# Patient Record
Sex: Female | Born: 1955
Health system: Southern US, Community
[De-identification: ages and names within clinical notes are randomized; demographics above are authoritative.]

## PROBLEM LIST (undated history)

## (undated) DIAGNOSIS — C801 Malignant (primary) neoplasm, unspecified: Secondary | ICD-10-CM

## (undated) DIAGNOSIS — G4730 Sleep apnea, unspecified: Secondary | ICD-10-CM

## (undated) DIAGNOSIS — D649 Anemia, unspecified: Secondary | ICD-10-CM

## (undated) DIAGNOSIS — R51 Headache: Secondary | ICD-10-CM

## (undated) DIAGNOSIS — M1990 Unspecified osteoarthritis, unspecified site: Secondary | ICD-10-CM

## (undated) DIAGNOSIS — D573 Sickle-cell trait: Secondary | ICD-10-CM

## (undated) DIAGNOSIS — R519 Headache, unspecified: Secondary | ICD-10-CM

## (undated) DIAGNOSIS — E785 Hyperlipidemia, unspecified: Secondary | ICD-10-CM

## (undated) DIAGNOSIS — F419 Anxiety disorder, unspecified: Secondary | ICD-10-CM

## (undated) HISTORY — PX: OTHER SURGICAL HISTORY: SHX169

## (undated) HISTORY — PX: TUBAL LIGATION: SHX77

## (undated) HISTORY — PX: EYE SURGERY: SHX253

## (undated) HISTORY — PX: FOOT SURGERY: SHX648

## (undated) HISTORY — PX: HERNIA REPAIR: SHX51

## (undated) HISTORY — PX: ABDOMINAL HYSTERECTOMY: SHX81

---

## 1998-05-21 ENCOUNTER — Inpatient Hospital Stay (HOSPITAL_COMMUNITY): Admission: AD | Admit: 1998-05-21 | Discharge: 1998-05-21 | Payer: Self-pay | Admitting: Obstetrics and Gynecology

## 1999-09-12 ENCOUNTER — Other Ambulatory Visit: Admission: RE | Admit: 1999-09-12 | Discharge: 1999-09-12 | Payer: Self-pay | Admitting: Obstetrics and Gynecology

## 1999-10-06 ENCOUNTER — Ambulatory Visit (HOSPITAL_COMMUNITY): Admission: RE | Admit: 1999-10-06 | Discharge: 1999-10-06 | Payer: Self-pay | Admitting: Obstetrics and Gynecology

## 1999-10-06 ENCOUNTER — Encounter: Payer: Self-pay | Admitting: Obstetrics and Gynecology

## 2000-03-10 ENCOUNTER — Ambulatory Visit (HOSPITAL_COMMUNITY): Admission: RE | Admit: 2000-03-10 | Discharge: 2000-03-10 | Payer: Self-pay | Admitting: Family Medicine

## 2000-03-10 ENCOUNTER — Encounter: Payer: Self-pay | Admitting: Family Medicine

## 2000-11-18 ENCOUNTER — Other Ambulatory Visit: Admission: RE | Admit: 2000-11-18 | Discharge: 2000-11-18 | Payer: Self-pay | Admitting: Obstetrics and Gynecology

## 2000-11-25 ENCOUNTER — Inpatient Hospital Stay (HOSPITAL_COMMUNITY): Admission: RE | Admit: 2000-11-25 | Discharge: 2000-11-28 | Payer: Self-pay | Admitting: Obstetrics and Gynecology

## 2001-07-08 ENCOUNTER — Ambulatory Visit (HOSPITAL_COMMUNITY): Admission: RE | Admit: 2001-07-08 | Discharge: 2001-07-08 | Payer: Self-pay | Admitting: Obstetrics and Gynecology

## 2001-07-08 ENCOUNTER — Encounter: Payer: Self-pay | Admitting: Obstetrics and Gynecology

## 2002-03-24 ENCOUNTER — Encounter: Payer: Self-pay | Admitting: Emergency Medicine

## 2002-03-24 ENCOUNTER — Emergency Department (HOSPITAL_COMMUNITY): Admission: EM | Admit: 2002-03-24 | Discharge: 2002-03-24 | Payer: Self-pay | Admitting: Emergency Medicine

## 2002-04-25 ENCOUNTER — Other Ambulatory Visit: Admission: RE | Admit: 2002-04-25 | Discharge: 2002-04-25 | Payer: Self-pay | Admitting: Family Medicine

## 2002-08-18 ENCOUNTER — Emergency Department (HOSPITAL_COMMUNITY): Admission: EM | Admit: 2002-08-18 | Discharge: 2002-08-18 | Payer: Self-pay | Admitting: Emergency Medicine

## 2002-09-25 ENCOUNTER — Encounter: Admission: RE | Admit: 2002-09-25 | Discharge: 2002-11-20 | Payer: Self-pay | Admitting: Occupational Medicine

## 2002-11-29 ENCOUNTER — Encounter: Admission: RE | Admit: 2002-11-29 | Discharge: 2002-11-29 | Payer: Self-pay | Admitting: Family Medicine

## 2002-11-29 ENCOUNTER — Encounter: Payer: Self-pay | Admitting: Family Medicine

## 2003-01-25 ENCOUNTER — Ambulatory Visit (HOSPITAL_COMMUNITY): Admission: RE | Admit: 2003-01-25 | Discharge: 2003-01-25 | Payer: Self-pay | Admitting: Family Medicine

## 2003-01-25 ENCOUNTER — Encounter: Payer: Self-pay | Admitting: Family Medicine

## 2003-05-04 ENCOUNTER — Other Ambulatory Visit: Admission: RE | Admit: 2003-05-04 | Discharge: 2003-05-04 | Payer: Self-pay | Admitting: Family Medicine

## 2003-11-10 HISTORY — PX: PORTACATH PLACEMENT: SHX2246

## 2003-11-10 HISTORY — PX: COLON SURGERY: SHX602

## 2004-01-03 ENCOUNTER — Other Ambulatory Visit: Admission: RE | Admit: 2004-01-03 | Discharge: 2004-01-03 | Payer: Self-pay | Admitting: Obstetrics and Gynecology

## 2004-01-17 ENCOUNTER — Ambulatory Visit (HOSPITAL_COMMUNITY): Admission: RE | Admit: 2004-01-17 | Discharge: 2004-01-17 | Payer: Self-pay | Admitting: Gastroenterology

## 2004-01-17 ENCOUNTER — Encounter (INDEPENDENT_AMBULATORY_CARE_PROVIDER_SITE_OTHER): Payer: Self-pay | Admitting: *Deleted

## 2004-01-23 ENCOUNTER — Ambulatory Visit (HOSPITAL_COMMUNITY): Admission: RE | Admit: 2004-01-23 | Discharge: 2004-01-23 | Payer: Self-pay | Admitting: Gastroenterology

## 2004-02-08 ENCOUNTER — Inpatient Hospital Stay (HOSPITAL_COMMUNITY): Admission: RE | Admit: 2004-02-08 | Discharge: 2004-02-16 | Payer: Self-pay | Admitting: General Surgery

## 2004-02-08 ENCOUNTER — Encounter (INDEPENDENT_AMBULATORY_CARE_PROVIDER_SITE_OTHER): Payer: Self-pay | Admitting: Specialist

## 2004-02-17 ENCOUNTER — Inpatient Hospital Stay (HOSPITAL_COMMUNITY): Admission: AD | Admit: 2004-02-17 | Discharge: 2004-02-19 | Payer: Self-pay | Admitting: General Surgery

## 2004-04-03 ENCOUNTER — Ambulatory Visit (HOSPITAL_COMMUNITY): Admission: RE | Admit: 2004-04-03 | Discharge: 2004-04-03 | Payer: Self-pay | Admitting: Obstetrics and Gynecology

## 2004-04-17 ENCOUNTER — Ambulatory Visit (HOSPITAL_COMMUNITY): Admission: RE | Admit: 2004-04-17 | Discharge: 2004-04-17 | Payer: Self-pay | Admitting: Oncology

## 2004-07-18 ENCOUNTER — Ambulatory Visit (HOSPITAL_COMMUNITY): Admission: RE | Admit: 2004-07-18 | Discharge: 2004-07-18 | Payer: Self-pay | Admitting: Oncology

## 2004-08-30 ENCOUNTER — Ambulatory Visit: Payer: Self-pay | Admitting: Oncology

## 2004-09-10 ENCOUNTER — Ambulatory Visit (HOSPITAL_COMMUNITY): Admission: RE | Admit: 2004-09-10 | Discharge: 2004-09-10 | Payer: Self-pay | Admitting: Oncology

## 2004-10-15 ENCOUNTER — Ambulatory Visit (HOSPITAL_COMMUNITY): Admission: RE | Admit: 2004-10-15 | Discharge: 2004-10-16 | Payer: Self-pay | Admitting: General Surgery

## 2004-11-07 ENCOUNTER — Ambulatory Visit (HOSPITAL_BASED_OUTPATIENT_CLINIC_OR_DEPARTMENT_OTHER): Admission: RE | Admit: 2004-11-07 | Discharge: 2004-11-07 | Payer: Self-pay | Admitting: *Deleted

## 2004-11-18 ENCOUNTER — Ambulatory Visit: Payer: Self-pay | Admitting: Oncology

## 2004-12-11 ENCOUNTER — Ambulatory Visit (HOSPITAL_COMMUNITY): Admission: RE | Admit: 2004-12-11 | Discharge: 2004-12-11 | Payer: Self-pay | Admitting: Oncology

## 2005-01-09 ENCOUNTER — Ambulatory Visit (HOSPITAL_COMMUNITY): Admission: RE | Admit: 2005-01-09 | Discharge: 2005-01-09 | Payer: Self-pay | Admitting: Urology

## 2005-01-20 ENCOUNTER — Ambulatory Visit (HOSPITAL_COMMUNITY): Admission: RE | Admit: 2005-01-20 | Discharge: 2005-01-20 | Payer: Self-pay | Admitting: Oncology

## 2005-01-23 ENCOUNTER — Ambulatory Visit: Payer: Self-pay | Admitting: Oncology

## 2005-01-27 ENCOUNTER — Ambulatory Visit (HOSPITAL_COMMUNITY): Admission: RE | Admit: 2005-01-27 | Discharge: 2005-01-27 | Payer: Self-pay | Admitting: Oncology

## 2005-04-01 ENCOUNTER — Ambulatory Visit (HOSPITAL_COMMUNITY): Admission: RE | Admit: 2005-04-01 | Discharge: 2005-04-01 | Payer: Self-pay | Admitting: Obstetrics and Gynecology

## 2005-05-20 ENCOUNTER — Other Ambulatory Visit: Admission: RE | Admit: 2005-05-20 | Discharge: 2005-05-20 | Payer: Self-pay | Admitting: Obstetrics and Gynecology

## 2005-06-22 ENCOUNTER — Ambulatory Visit: Payer: Self-pay | Admitting: Oncology

## 2005-08-05 ENCOUNTER — Ambulatory Visit (HOSPITAL_COMMUNITY): Admission: RE | Admit: 2005-08-05 | Discharge: 2005-08-05 | Payer: Self-pay | Admitting: Gastroenterology

## 2006-04-06 ENCOUNTER — Ambulatory Visit (HOSPITAL_COMMUNITY): Admission: RE | Admit: 2006-04-06 | Discharge: 2006-04-06 | Payer: Self-pay | Admitting: Obstetrics and Gynecology

## 2006-06-02 ENCOUNTER — Ambulatory Visit (HOSPITAL_COMMUNITY): Admission: RE | Admit: 2006-06-02 | Discharge: 2006-06-02 | Payer: Self-pay | Admitting: Oncology

## 2006-06-18 ENCOUNTER — Other Ambulatory Visit: Admission: RE | Admit: 2006-06-18 | Discharge: 2006-06-18 | Payer: Self-pay | Admitting: Obstetrics and Gynecology

## 2006-06-21 ENCOUNTER — Ambulatory Visit: Payer: Self-pay | Admitting: Oncology

## 2006-06-23 LAB — CBC WITH DIFFERENTIAL/PLATELET
Basophils Absolute: 0 10*3/uL (ref 0.0–0.1)
Eosinophils Absolute: 0 10*3/uL (ref 0.0–0.5)
HCT: 37.2 % (ref 34.8–46.6)
HGB: 12.8 g/dL (ref 11.6–15.9)
LYMPH%: 49.3 % — ABNORMAL HIGH (ref 14.0–48.0)
MCV: 86.7 fL (ref 81.0–101.0)
MONO%: 9.7 % (ref 0.0–13.0)
NEUT#: 2.4 10*3/uL (ref 1.5–6.5)
NEUT%: 39.8 % (ref 39.6–76.8)
Platelets: 376 10*3/uL (ref 145–400)
RBC: 4.29 10*6/uL (ref 3.70–5.32)

## 2006-06-23 LAB — COMPREHENSIVE METABOLIC PANEL
BUN: 12 mg/dL (ref 6–23)
CO2: 29 mEq/L (ref 19–32)
Calcium: 9.9 mg/dL (ref 8.4–10.5)
Creatinine, Ser: 0.84 mg/dL (ref 0.40–1.20)
Glucose, Bld: 81 mg/dL (ref 70–99)
Total Bilirubin: 0.6 mg/dL (ref 0.3–1.2)

## 2006-11-09 DIAGNOSIS — G4730 Sleep apnea, unspecified: Secondary | ICD-10-CM

## 2006-11-09 HISTORY — DX: Sleep apnea, unspecified: G47.30

## 2006-12-10 ENCOUNTER — Ambulatory Visit: Payer: Self-pay | Admitting: Oncology

## 2007-04-08 ENCOUNTER — Ambulatory Visit (HOSPITAL_COMMUNITY): Admission: RE | Admit: 2007-04-08 | Discharge: 2007-04-08 | Payer: Self-pay | Admitting: Oncology

## 2007-06-07 ENCOUNTER — Ambulatory Visit: Payer: Self-pay | Admitting: Oncology

## 2007-06-10 LAB — COMPREHENSIVE METABOLIC PANEL
ALT: 16 U/L (ref 0–35)
AST: 19 U/L (ref 0–37)
CO2: 26 mEq/L (ref 19–32)
Calcium: 9.8 mg/dL (ref 8.4–10.5)
Chloride: 102 mEq/L (ref 96–112)
Creatinine, Ser: 0.72 mg/dL (ref 0.40–1.20)
Potassium: 3.9 mEq/L (ref 3.5–5.3)
Sodium: 142 mEq/L (ref 135–145)
Total Protein: 8.1 g/dL (ref 6.0–8.3)

## 2007-06-10 LAB — CBC WITH DIFFERENTIAL/PLATELET
BASO%: 0.5 % (ref 0.0–2.0)
EOS%: 0.9 % (ref 0.0–7.0)
HCT: 37.7 % (ref 34.8–46.6)
MCH: 29.8 pg (ref 26.0–34.0)
MCHC: 34.5 g/dL (ref 32.0–36.0)
MONO#: 0.5 10*3/uL (ref 0.1–0.9)
NEUT%: 36.2 % — ABNORMAL LOW (ref 39.6–76.8)
RBC: 4.37 10*6/uL (ref 3.70–5.32)
RDW: 13.1 % (ref 11.3–14.5)
WBC: 5.3 10*3/uL (ref 3.9–10.0)
lymph#: 2.8 10*3/uL (ref 0.9–3.3)

## 2007-06-10 LAB — LIPID PANEL
HDL: 43 mg/dL (ref >39)
LDL Cholesterol: 101 mg/dL — ABNORMAL HIGH (ref 0–99)

## 2007-06-10 LAB — CEA: CEA: 1.2 ng/mL (ref 0.0–5.0)

## 2007-06-17 ENCOUNTER — Other Ambulatory Visit: Admission: RE | Admit: 2007-06-17 | Discharge: 2007-06-17 | Payer: Self-pay | Admitting: Obstetrics and Gynecology

## 2007-12-08 ENCOUNTER — Ambulatory Visit: Payer: Self-pay | Admitting: Oncology

## 2007-12-12 LAB — CBC WITH DIFFERENTIAL/PLATELET
EOS%: 0.8 % (ref 0.0–7.0)
Eosinophils Absolute: 0.1 10*3/uL (ref 0.0–0.5)
MCV: 86.8 fL (ref 81.0–101.0)
MONO%: 9.9 % (ref 0.0–13.0)
NEUT#: 2.3 10*3/uL (ref 1.5–6.5)
RBC: 4.58 10*6/uL (ref 3.70–5.32)
RDW: 12.7 % (ref 11.3–14.5)

## 2007-12-12 LAB — COMPREHENSIVE METABOLIC PANEL
AST: 26 U/L (ref 0–37)
Albumin: 4.6 g/dL (ref 3.5–5.2)
Alkaline Phosphatase: 58 U/L (ref 39–117)
BUN: 15 mg/dL (ref 6–23)
Potassium: 3.9 mEq/L (ref 3.5–5.3)
Sodium: 141 mEq/L (ref 135–145)

## 2008-04-23 ENCOUNTER — Ambulatory Visit (HOSPITAL_COMMUNITY): Admission: RE | Admit: 2008-04-23 | Discharge: 2008-04-23 | Payer: Self-pay | Admitting: Oncology

## 2008-05-21 ENCOUNTER — Emergency Department (HOSPITAL_COMMUNITY): Admission: EM | Admit: 2008-05-21 | Discharge: 2008-05-21 | Payer: Self-pay | Admitting: Family Medicine

## 2008-05-24 ENCOUNTER — Encounter: Payer: Self-pay | Admitting: Family Medicine

## 2008-05-24 ENCOUNTER — Ambulatory Visit: Payer: Self-pay | Admitting: Family Medicine

## 2008-05-24 DIAGNOSIS — Z85038 Personal history of other malignant neoplasm of large intestine: Secondary | ICD-10-CM | POA: Insufficient documentation

## 2008-05-24 DIAGNOSIS — Z8669 Personal history of other diseases of the nervous system and sense organs: Secondary | ICD-10-CM | POA: Insufficient documentation

## 2008-05-24 DIAGNOSIS — E785 Hyperlipidemia, unspecified: Secondary | ICD-10-CM | POA: Insufficient documentation

## 2008-05-24 DIAGNOSIS — N951 Menopausal and female climacteric states: Secondary | ICD-10-CM | POA: Insufficient documentation

## 2008-05-24 LAB — CONVERTED CEMR LAB

## 2008-05-25 LAB — CONVERTED CEMR LAB
Albumin: 4.6 g/dL (ref 3.5–5.2)
BUN: 18 mg/dL (ref 6–23)
CO2: 24 meq/L (ref 19–32)
Calcium: 9.9 mg/dL (ref 8.4–10.5)
Chloride: 103 meq/L (ref 96–112)
Glucose, Bld: 93 mg/dL (ref 70–99)
HDL: 50 mg/dL (ref >39)
Potassium: 4.1 meq/L (ref 3.5–5.3)
Sodium: 140 meq/L (ref 135–145)
Total Protein: 8.5 g/dL — ABNORMAL HIGH (ref 6.0–8.3)
Triglycerides: 190 mg/dL — ABNORMAL HIGH (ref <150)

## 2008-05-28 ENCOUNTER — Ambulatory Visit: Payer: Self-pay | Admitting: Sports Medicine

## 2008-07-10 ENCOUNTER — Ambulatory Visit: Payer: Self-pay | Admitting: Oncology

## 2008-07-13 ENCOUNTER — Ambulatory Visit (HOSPITAL_COMMUNITY): Admission: RE | Admit: 2008-07-13 | Discharge: 2008-07-13 | Payer: Self-pay | Admitting: Oncology

## 2008-07-18 ENCOUNTER — Encounter: Payer: Self-pay | Admitting: Family Medicine

## 2008-07-18 LAB — CBC WITH DIFFERENTIAL/PLATELET
Basophils Absolute: 0 10*3/uL (ref 0.0–0.1)
EOS%: 0.5 % (ref 0.0–7.0)
Eosinophils Absolute: 0 10*3/uL (ref 0.0–0.5)
HGB: 12.8 g/dL (ref 11.6–15.9)
MONO#: 0.6 10*3/uL (ref 0.1–0.9)
NEUT#: 2.3 10*3/uL (ref 1.5–6.5)
RDW: 13.5 % (ref 11.3–14.5)
WBC: 5.5 10*3/uL (ref 3.9–10.0)
lymph#: 2.5 10*3/uL (ref 0.9–3.3)

## 2008-07-18 LAB — COMPREHENSIVE METABOLIC PANEL
AST: 29 U/L (ref 0–37)
Albumin: 4 g/dL (ref 3.5–5.2)
BUN: 12 mg/dL (ref 6–23)
CO2: 27 mEq/L (ref 19–32)
Calcium: 9.8 mg/dL (ref 8.4–10.5)
Chloride: 103 mEq/L (ref 96–112)
Glucose, Bld: 102 mg/dL — ABNORMAL HIGH (ref 70–99)
Potassium: 3.9 mEq/L (ref 3.5–5.3)

## 2008-07-18 LAB — LIPID PANEL
LDL Cholesterol: 133 mg/dL — ABNORMAL HIGH (ref 0–99)
VLDL: 34 mg/dL (ref 0–40)

## 2008-07-25 ENCOUNTER — Encounter: Payer: Self-pay | Admitting: Family Medicine

## 2008-08-08 ENCOUNTER — Ambulatory Visit: Payer: Self-pay | Admitting: Family Medicine

## 2008-08-08 ENCOUNTER — Encounter: Payer: Self-pay | Admitting: Family Medicine

## 2008-08-08 LAB — CONVERTED CEMR LAB
ALT: 16 units/L (ref 0–35)
Albumin: 4.5 g/dL (ref 3.5–5.2)
Alkaline Phosphatase: 51 units/L (ref 39–117)
CO2: 23 meq/L (ref 19–32)
Glucose, Bld: 87 mg/dL (ref 70–99)
Pap Smear: NORMAL
Potassium: 4 meq/L (ref 3.5–5.3)
Sodium: 140 meq/L (ref 135–145)
Total Protein: 7.8 g/dL (ref 6.0–8.3)

## 2008-08-10 ENCOUNTER — Telehealth: Payer: Self-pay | Admitting: Family Medicine

## 2008-08-14 ENCOUNTER — Ambulatory Visit (HOSPITAL_COMMUNITY): Admission: RE | Admit: 2008-08-14 | Discharge: 2008-08-14 | Payer: Self-pay | Admitting: Gastroenterology

## 2008-08-14 ENCOUNTER — Encounter: Payer: Self-pay | Admitting: Family Medicine

## 2008-08-20 ENCOUNTER — Telehealth: Payer: Self-pay | Admitting: *Deleted

## 2008-09-05 ENCOUNTER — Telehealth: Payer: Self-pay | Admitting: *Deleted

## 2008-09-06 ENCOUNTER — Ambulatory Visit: Payer: Self-pay | Admitting: Family Medicine

## 2008-09-06 ENCOUNTER — Encounter: Payer: Self-pay | Admitting: Family Medicine

## 2008-09-06 LAB — CONVERTED CEMR LAB
Bilirubin Urine: NEGATIVE
Protein, U semiquant: NEGATIVE
Urobilinogen, UA: 0.2
Whiff Test: NEGATIVE

## 2008-11-19 ENCOUNTER — Telehealth: Payer: Self-pay | Admitting: *Deleted

## 2008-11-19 ENCOUNTER — Ambulatory Visit: Payer: Self-pay | Admitting: Family Medicine

## 2008-11-19 DIAGNOSIS — J301 Allergic rhinitis due to pollen: Secondary | ICD-10-CM | POA: Insufficient documentation

## 2009-01-18 ENCOUNTER — Telehealth (INDEPENDENT_AMBULATORY_CARE_PROVIDER_SITE_OTHER): Payer: Self-pay | Admitting: *Deleted

## 2009-01-23 ENCOUNTER — Ambulatory Visit: Payer: Self-pay | Admitting: Family Medicine

## 2009-01-23 LAB — CONVERTED CEMR LAB: Whiff Test: NEGATIVE

## 2009-02-26 ENCOUNTER — Ambulatory Visit: Payer: Self-pay | Admitting: Oncology

## 2009-02-28 LAB — CBC WITH DIFFERENTIAL/PLATELET
EOS%: 0.6 % (ref 0.0–7.0)
Eosinophils Absolute: 0 10*3/uL (ref 0.0–0.5)
HGB: 12.7 g/dL (ref 11.6–15.9)
MCV: 90.1 fL (ref 79.5–101.0)
MONO%: 7 % (ref 0.0–14.0)
NEUT#: 2.9 10*3/uL (ref 1.5–6.5)
RBC: 4.15 10*6/uL (ref 3.70–5.45)
RDW: 13.3 % (ref 11.2–14.5)
lymph#: 3.1 10*3/uL (ref 0.9–3.3)

## 2009-02-28 LAB — COMPREHENSIVE METABOLIC PANEL
AST: 30 U/L (ref 0–37)
Albumin: 4.1 g/dL (ref 3.5–5.2)
Alkaline Phosphatase: 47 U/L (ref 39–117)
Calcium: 9.7 mg/dL (ref 8.4–10.5)
Chloride: 102 mEq/L (ref 96–112)
Glucose, Bld: 97 mg/dL (ref 70–99)
Potassium: 3.3 mEq/L — ABNORMAL LOW (ref 3.5–5.3)
Sodium: 141 mEq/L (ref 135–145)
Total Protein: 8.1 g/dL (ref 6.0–8.3)

## 2009-03-04 ENCOUNTER — Ambulatory Visit (HOSPITAL_COMMUNITY): Admission: RE | Admit: 2009-03-04 | Discharge: 2009-03-04 | Payer: Self-pay | Admitting: Oncology

## 2009-03-07 ENCOUNTER — Encounter: Payer: Self-pay | Admitting: Family Medicine

## 2009-03-25 ENCOUNTER — Telehealth: Payer: Self-pay | Admitting: *Deleted

## 2009-04-24 ENCOUNTER — Ambulatory Visit (HOSPITAL_COMMUNITY): Admission: RE | Admit: 2009-04-24 | Discharge: 2009-04-24 | Payer: Self-pay | Admitting: Obstetrics and Gynecology

## 2009-04-30 ENCOUNTER — Telehealth: Payer: Self-pay | Admitting: Family Medicine

## 2009-05-01 ENCOUNTER — Ambulatory Visit: Payer: Self-pay | Admitting: Family Medicine

## 2009-07-10 ENCOUNTER — Ambulatory Visit: Payer: Self-pay | Admitting: Family Medicine

## 2009-08-16 ENCOUNTER — Encounter: Payer: Self-pay | Admitting: Family Medicine

## 2009-08-16 ENCOUNTER — Ambulatory Visit: Payer: Self-pay | Admitting: Family Medicine

## 2009-08-16 LAB — CONVERTED CEMR LAB
ALT: 26 units/L (ref 0–35)
AST: 24 units/L (ref 0–37)
Alkaline Phosphatase: 49 units/L (ref 39–117)
BUN: 15 mg/dL (ref 6–23)
Calcium: 9.6 mg/dL (ref 8.4–10.5)
Chloride: 105 meq/L (ref 96–112)
Creatinine, Ser: 0.83 mg/dL (ref 0.40–1.20)
HDL: 51 mg/dL (ref >39)
LDL Cholesterol: 102 mg/dL — ABNORMAL HIGH (ref 0–99)
Total Bilirubin: 0.4 mg/dL (ref 0.3–1.2)
Total CHOL/HDL Ratio: 3.8
VLDL: 40 mg/dL (ref 0–40)

## 2009-08-20 ENCOUNTER — Encounter: Payer: Self-pay | Admitting: Family Medicine

## 2009-11-09 HISTORY — PX: CARPAL TUNNEL RELEASE: SHX101

## 2010-03-28 ENCOUNTER — Ambulatory Visit (HOSPITAL_COMMUNITY): Admission: RE | Admit: 2010-03-28 | Discharge: 2010-03-28 | Payer: Self-pay | Admitting: Orthopaedic Surgery

## 2010-04-25 ENCOUNTER — Ambulatory Visit (HOSPITAL_COMMUNITY): Admission: RE | Admit: 2010-04-25 | Discharge: 2010-04-25 | Payer: Self-pay | Admitting: Obstetrics and Gynecology

## 2010-06-04 ENCOUNTER — Telehealth: Payer: Self-pay | Admitting: Family Medicine

## 2010-06-09 ENCOUNTER — Encounter: Payer: Self-pay | Admitting: *Deleted

## 2010-11-30 ENCOUNTER — Encounter: Payer: Self-pay | Admitting: Oncology

## 2010-12-09 NOTE — Progress Notes (Signed)
Summary: phnmsg   Will electronically refill her Pravastastatin #30 with 3 refills at [facility] [address]  Will need to schedule patient for her annual follow-up in October 2011.  Please call patient and let her know: 1) she will need to come see her PCP in October 2) She can pick up her medication on [address] and 3) will need to be fasting for possible lab work.  Thank you!  Phone Note Call from Patient Call back at Home Phone [phone] Call back at [phone]   Caller: Patient Summary of Call: has a question about the prevastatin Initial call taken by: De Nurse,  June 04, 2010 4:52 PM  Follow-up for Phone Call        Faythe Dingwall gets 3 month supply at a time at [facility] per pt. according to med list she gets #30 with refills. she wants refills. has not been here since 10/10. told her mds usually need labs with this med. I will ask her pcp is ok to fill. she has no money, not elig for [place]. living on small amount from unemployment told her I will call her back Follow-up by: Golden Circle RN,  June 05, 2010 8:35 AM    Prescriptions: PRAVACHOL 40 MG TABS (PRAVASTATIN SODIUM) one tab by mouth at bedtime.  #30 x 3   Entered and Authorized by:   Kealey Kemmer de Lawson Radar  MD   Signed by:   Barnabas Lister  MD on 06/05/2010   Method used:   Electronically to        [facility] [phone]* (retail)       [address]       [place], [place]  [address]       Ph: [phone]       Fax: [phone]   RxID:   [id number]   Appended Document: phnmsg pt notified

## 2010-12-09 NOTE — Miscellaneous (Signed)
Summary: future fasting labs  Clinical Lists Changes  Orders: Added new Test order of Comp Met-[facility] [phone]) - Signed Added new Test order of Lipid-[facility] ([id number]) - Signed

## 2011-01-26 LAB — CBC
MCV: 90.3 fL (ref 78.0–100.0)
Platelets: 318 10*3/uL (ref 150–400)
RBC: 3.98 MIL/uL (ref 3.87–5.11)
WBC: 5.9 10*3/uL (ref 4.0–10.5)

## 2011-01-26 LAB — PROTIME-INR
INR: 0.96 (ref 0.00–1.49)
Prothrombin Time: 12.7 seconds (ref 11.6–15.2)

## 2011-03-24 NOTE — Op Note (Signed)
Amanda [name], Amanda [name]             ACCOUNT NO.:  [id number]   MEDICAL RECORD NO.:  [id number]          PATIENT TYPE:  AMB   LOCATION:  ENDO                         FACILITY:  [facility]   PHYSICIAN:  John C. Madilyn Fireman, M.D.    DATE OF BIRTH:  [birth year]   DATE OF PROCEDURE:  08/14/2008  DATE OF DISCHARGE:                               OPERATIVE REPORT   OPERATION:  Colonoscopy.   INDICATIONS FOR PROCEDURE:  History of colon cancer with last  colonoscopy 3 years ago with original diagnosis 4 years ago.   PROCEDURE:  The patient was placed in the left lateral decubitus  position and placed on the pulse monitor with continuous low-flow oxygen  delivered via nasal cannula.  She was sedated with 60 mcg of IV fentanyl  and 6 mg of IV Versed.  The Olympus video colonoscope was inserted into  the rectum and advanced to cecum, confirmed by transillumination of  McBurney point and visualization of the ileocecal valve and appendiceal  orifice.  The prep was good.  The cecum, ascending, transverse, and  descending colon all appeared normal with no masses, polyps,  diverticula, or other mucosal abnormalities.  Approximately at 30 cm,  there was a well-healed circumferential surgical scar with slight  indentation and fibrosis consistent with previous hemicolectomy site.  There was no visible suspicion of neoplasm.  No biopsies were taken.  The mucosa of the rectum and rectosigmoid distal to this appeared normal  all the way down to the anus, and retroflexed view revealed no obviously  enlarged internal hemorrhoids.  The scope was then withdrawn, and the  patient returned to the recovery room in stable condition.  She  tolerated the procedure well, and there were no immediate complications.   IMPRESSION:  Normal colonoscopy with evidence of left hemicolectomy.   PLAN:  Repeat colonoscopy in 5 years.           ______________________________  Everardo All Madilyn Fireman, M.D.     JCH/MEDQ  D:  08/14/2008  T:   08/15/2008  Job:  [id number]   cc:   Valentino Hue. Magrinat, M.D.

## 2011-03-27 NOTE — Op Note (Signed)
[facility]  Patient:    Amanda [name], Amanda [name]                      MRN: [id number] Proc. Date: 11/25/00 Adm. Date:  [id number] Disc. Date: [id number] Attending:  Sharon Mt                           Operative Report  PREOPERATIVE DIAGNOSIS:  Symptomatic fibroids.  POSTOPERATIVE DIAGNOSIS:  Symptomatic fibroids.  OPERATION PERFORMED:  Total abdominal hysterectomy.  SURGEON:  Dr. Eda Paschal.  FIRST ASSISTANT:  Dr. Phyllis Ginger.  ANESTHESIA:  General endotracheal.  FINDINGS:  The patients uterus was enlarged by multiple myomas to about 10-11 weeks size. Ovaries, fallopian tubes and pelvic peritoneum were free of disease. The ileocecal junction was identified, the patient had a normal retrocecal appendix. The only other abnormal finding besides the fibroids was a very densely adherent bladder from her previous cesarean section.  DESCRIPTION OF PROCEDURE:  After adequate general endotracheal anesthesia, the patient was placed in the supine position, prepped and draped in the usual sterile manner. A Foley catheter was inserted in the patients bladder. A Pfannenstiel incision was made. The fascia was opened transversely, the peritoneum was entered vertically. Subcutaneous bleeders clamped and bovied as encountered. When the peritoneal cavity was opened, the above findings were noted. The round ligaments were clamped, cut and suture ligated with #1 chromic catgut. The vesicouterine fold to peritoneum was sharply incised as was the posterior peritoneum. Adnexa were negative and therefore her ovaries were conserved. Her utero-ovarian ligaments, round ligaments, and fallopian tubes were clamped, cut and suture ligated with #1 chromic catgut. The bladder flap continued to be advanced. This was difficult because of adherence from a previous cesarean section but it was done without entering the bladder although significant bleeding was encountered which was  controlled with a Bovie being exceedingly careful not to Bovie deep enough to injure the bladder. Urine remained clear throughout the procedure. The uterine arteries were clamped, cut and doubly suture ligated with #1 chromic catgut. The parametrium was taken down in successive bites by clamping, cutting, and suture ligating with #1 chromic catgut. The uterosacral ligaments were clamped, cut and suture ligated with #1 chromic catgut. The cervicovaginal junction was entered. The uterus with cervix was sent to pathology for tissue diagnosis. Angled sutures were placed in the angles of the vagina incorporating uterosacrals and cardinal ligaments for good vault support. The cuff was closed with figure-of-eights of #0 Vicryl. Copious irrigation was was done with Ringers lactate. Two sponge, needle and instrument counts were correct. The peritoneum was closed with a running #0 Vicryl. Sub and superfascial spaces were copiously irrigated. The fascia was closed with two running #0 Vicryl and the skin was closed with staples. Estimated blood loss for the entire procedure was 300 cc with none replaced. The patient tolerated the procedure well and left the operating room in satisfactory condition draining clear urine from her Foley catheter. DD:  11/25/00 TD:  11/26/00 Job: [id number]

## 2011-03-27 NOTE — Op Note (Signed)
NAMEQUEEN, ABBETT                         ACCOUNT NO.:  [id number]   MEDICAL RECORD NO.:  [id number]                   PATIENT TYPE:  INP   LOCATION:  5705                                 FACILITY:  [facility]   PHYSICIAN:  Leonie Man, M.D.                DATE OF BIRTH:  01/19/1956   DATE OF PROCEDURE:  02/08/2004  DATE OF DISCHARGE:                                 OPERATIVE REPORT   PREOPERATIVE DIAGNOSIS:  Carcinoma of the sigmoid colon.   POSTOPERATIVE DIAGNOSIS:  Carcinoma of the sigmoid colon.   PROCEDURE:  Sigmoid colectomy with colocolic anastomosis.   SURGEON:  Leonie Man, M.D.   ASSISTANT:  Ollen Gross. Carolynne Edouard, M.D.   ANESTHESIA:  General.   Ms. Husser is a 55 year old woman with hematochezia who on colonoscopy was  noted to have a lesion at approximately 18 cm above the anal verge.  On  biopsy this shows at least in-situ carcinoma.  CT scans of the abdomen have  been without radiologic evidence for metastatic disease.  CEA is less than  5, and liver function studies are mildly elevated.  This shows an SGOT of  68, SGPT of 63.   The patient is fully apprised of the risks and potential benefits of surgery  for colon cancer.  She understands these risks and gives consent and comes  to surgery.   PROCEDURE:  Following the induction of satisfactory general anesthesia, the  patient is placed in the lithotomy position.  I then did a  proctosigmoidoscopy up to 16 cm, at which point I could view the tumor.  The  proctosigmoidoscope was withdrawn and the gas from within the colon removed.  The patient's abdomen and perineum were then prepped and draped to be  included in the sterile operative field.  Exploratory laparotomy was carried  out through a lower midline incision extending from the pubic tubercle up to  and slightly above the umbilicus.  Upon entering the abdomen, multiple  adhesions from her previous pelvic surgery were taken down.  Exploration of  the abdomen  showed no evidence of parietal or visceral metastatic disease.  The mesentery did not feel abnormal.  The liver surfaces were smooth without  evidence of metastatic disease.  The omentum was clean.   Dissection of the sigmoid colon was started at the left retroperitoneal  reflection and carried down over the pelvic rim into the true pelvis on both  sides.  Both ureters and gonadal vessels were seen and protected throughout  the course of the dissection.  The tumor could be felt in approximately the  mid-sigmoid colon.  Dissection was carried to approximately 10 cm distal to  the tumor at the proximal rectum and approximately 10 cm proximal to the  tumor at the distal descending colon.  The bowel was then divided between  GIA staplers at this point.  The intervening mesentery all the way down to  the  region of the inferior mesenteric vessels was taken between clamps and  secured with ties of 2-0 silk.  The specimen was then removed in its  entirety and forwarded for pathologic evaluation.  We then effected an end-  to-end anastomosis using an EEA stapler passed into the colon from through  the anus and rectum and with the anvil placed in the proximal bowel.  The  resulting anastomosis was noted to be intact.  The proctosigmoidoscope was  placed into the rectum again and air was pumped into the colon with fluid in  the pelvis.  There was no evidence of an air leak, and the anastomosis was  well-seen without any evidence of bleeding.   Sponge, instrument, and sharp counts were then fully verified.  The  peritoneal cavity was thoroughly irrigated with normal saline and all areas  of dissection checked for hemostasis, additional bleeding points treated  with electrocautery or with clips or ties of 3-0 silk.  The wound was then  closed in layers after a second sponge count was obtained.  The midline was  closed with a running #1 PDS suture, the subcutaneous tissues were  irrigated, and the skin  was closed with stainless steel staples.  Sterile  dressings were then applied to the wound, the anesthesia reversed, and the  patient removed from the operating room to the recovery room in stable  condition.  She tolerated the procedure well.                                               Leonie Man, M.D.    PB/MEDQ  D:  02/08/2004  T:  02/08/2004  Job:  [id number]   cc:   Tanya D. Daphine Deutscher, M.D.  [address]  [place]  [place] [address]  Fax: [phone]   Everardo All. Madilyn Fireman, M.D.  [address]  [place]  [place] [address]  Fax: [phone]

## 2011-03-27 NOTE — Op Note (Signed)
[name], Amanda [name]             ACCOUNT NO.:  [id number]   MEDICAL RECORD NO.:  [id number]          PATIENT TYPE:  AMB   LOCATION:  ENDO                         FACILITY:  [facility]   PHYSICIAN:  John C. Madilyn Fireman, M.D.    DATE OF BIRTH:  11-26-1955   DATE OF PROCEDURE:  08/05/2005  DATE OF DISCHARGE:                                 OPERATIVE REPORT   PROCEDURE:  Colonoscopy.   INDICATIONS FOR PROCEDURE:  History of sigmoid colon cancer requiring  surgery 1 year ago with postoperative chemotherapy.   DESCRIPTION OF PROCEDURE:  The patient was placed in the left lateral  decubitus position then placed on the pulse monitor with continuous low-flow  oxygen delivered by nasal cannula. She was sedated with 75 mcg IV fentanyl  and 5 mg IV Versed. The Olympus video colonoscope was inserted into the  rectum and advanced to cecum, confirmed by transillumination at McBurney's  point visualization at the ileocecal valve and appendiceal orifice. The prep  was suboptimal and I could not rule out lesions less than 1 cm in diameter  in most areas, otherwise the cecum, ascending, transverse and descending  colon all appeared normal with no masses, polyps, diverticula or other  mucosal abnormalities. The surgical anastomosis from previous left  hemicolectomy was identified at 25 cm and appeared intact with no visible  suspicion of neoplasm. The mucosa distal to the anastomosis likewise  appeared normal. The scope was then withdrawn and the patient returned to  the recovery room in stable condition. She tolerated the procedure well and  there were no immediate complications.   IMPRESSION:  Normal colonoscopy with somewhat limited prep, status post left  hemicolectomy.   PLAN:  Repeat colonoscopy in 3 years.           ______________________________  Everardo All Madilyn Fireman, M.D.     JCH/MEDQ  D:  08/05/2005  T:  08/05/2005  Job:  [id number]   cc:   Valentino Hue. Magrinat, M.D.  Fax: [phone]   Leonie Man,  M.D.  [address]  [place]  [place] [address]   Cephas Darby. Daphine Deutscher, M.D.  Fax: [phone]

## 2011-03-27 NOTE — H&P (Signed)
[facility]  Patient:    Amanda [name], Amanda [name]                     MRN: [id number] Attending:  Rande Brunt. Eda Paschal, M.D.                         History and Physical  CHIEF COMPLAINT:  Menometrorrhagia with dysmenorrhea.  HISTORY OF PRESENT ILLNESS:  Patient is a 55 year old gravida 2, para 2, Ab0, who enters the hospital now for total abdominal hysterectomy because of symptomatic fibroids.  She is having severe flooding with her periods that makes her have to stay at home; she is also having severe dysmenorrhea.  It has not responded to nonsteroidal anti-inflammatory drugs and she is ready to proceed with surgery.  Uterus is enlarged by multiple myelomas.  On her last ultrasound, she had a total of six identifiable fibroids, the largest of which was 5 cm.  Her adnexa appeared negative.  She had previously had a complex cyst of her left ovary which has resolved.  Our plan is to do a total abdominal hysterectomy and only to do a unilateral salpingo-oophorectomy or BSO for significant disease.  PAST MEDICAL HISTORY:  Patient has had a previous laparoscopic tubal ligation; she has also had a cesarean section.  She does have osteoarthritis of her hip and she also gets migraines and sinus infections.  PRESENT MEDICATIONS:  Nonsteroidal anti-inflammatory drugs.  ALLERGIES:  None known.  SOCIAL HISTORY:  She is a nonsmoker, nondrinker.  REVIEW OF SYSTEMS:  HEENT:  Negative except as noted above.  CARDIAC: Negative.  RESPIRATORY:  Negative.  GI:  Negative.  GU:  Negative. NEUROMUSCULAR:  Negative.  ENDOCRINE:  Negative.  PHYSICAL EXAMINATION  GENERAL:  Patient is a well-developed and well-nourished female in no acute distress.  VITAL SIGNS:  Blood pressure is 110/70.  Pulse is 80 and regular. Respirations are 16 and nonlabored.  She is afebrile.  HEENT:  All within normal limits.  NECK:  Supple.  Trachea in the midline.  Thyroid is not  enlarged.  LUNGS:  Clear to P&A.  HEART:  No thrills, heaves or murmurs.  BREASTS:  No masses.  ABDOMEN:  Soft without guarding, rebound or masses.  PELVIC:  External and vagina are within normal limits.  Cervix is clean.  Pap smear showed no atypia.  Uterus is enlarged by multiple myomas to 11 weeks size.  Adnexa are palpably normal.  RECTAL:  Negative.  EXTREMITIES:  Within normal limits.  ADMISSION IMPRESSION 1. Severe menometrorrhagia with multiple myomas. 2. Severe dysmenorrhea.  PLAN:  Total abdominal hysterectomy for treatment of the above. DD:  11/25/00 TD:  11/26/00 Job: [id number]

## 2011-03-27 NOTE — H&P (Signed)
Amanda [name], Amanda [name]                         ACCOUNT NO.:  [id number]   MEDICAL RECORD NO.:  [id number]                   PATIENT TYPE:  INP   LOCATION:  5742                                 FACILITY:  [facility]   PHYSICIAN:  Adolph Pollack, M.D.            DATE OF BIRTH:  18-Jan-1956   DATE OF ADMISSION:  02/17/2004  DATE OF DISCHARGE:                                HISTORY & PHYSICAL   REASON FOR ADMISSION:  Crampy abdominal pain, nausea and vomiting  postoperatively.   HISTORY OF PRESENT ILLNESS:  Amanda [name] is a 55 year old female who  underwent a sigmoid colectomy for T3, N0, M0 sigmoid colon cancer, February 08, 2004.  She was discharged from the hospital doing well by her report  yesterday.  Today, she began having some cramping periumbilical pain and  began having some significant nausea and vomiting that was bilious by her  report.  She felt very weak.  I gave her the option of trying some Phenergan  suppositories, liquid diet or coming to the hospital to be readmitted and  she chose to come to the hospital to be readmitted.  She states she has some  fever and chills as well.   PAST MEDICAL HISTORY:  1. Sigmoid colon cancer.  2. Stress urinary incontinence.  3. Sickle cell trait.  4. Hypercholesterolemia.   PREVIOUS OPERATIONS:  1. Hysterectomy.  2. Left foot surgery.  3. Tubal ligation.  4. Cesarean section  5. Sigmoid colectomy.   DRUG ALLERGIES:  None.   HOME MEDICINES:  1. Vitamins.  2. Zocor 40 mg a day.  3. Santura 10 mg a day.   SOCIAL HISTORY:  No tobacco or alcohol use.   PHYSICAL EXAM:  GENERAL:  Generally, a weak-appearing female, very pleasant  and cooperative.  VITAL SIGNS:  Her temperature is 98.4, blood pressure is 121/64, pulse of  91.  HEENT:  Eyes:  Extraocular motions intact.  No icterus noted.  NECK:  Neck supple without palpable mass.  CARDIOVASCULAR:  Cardiovascular demonstrates regular rate and rhythm.  No  murmur heard.  No lower  extremity edema.  RESPIRATORY:  Breath sounds equal and clear anteriorly.  Respirations  nonlabored.  ABDOMEN:  Abdomen is soft and nontender with a well-healing incision that is  clean and intact.  There is no significant distention.  Hypoactive bowel  sounds noted.  EXTREMITIES:  Good muscle tone.   LABORATORY DATA:  White cell count 7200, hemoglobin 10.4, electrolytes  within normal limits.  Slightly elevated SGOT and SGPT.   X-rays demonstrate some dilated small bowel loops with air-fluid levels.  There is some gas in the right and proximal left colon.   IMPRESSION:  Post sigmoid colectomy ileus versus partial small-bowel  obstruction.   PLAN:  Admit to the hospital.  Start bowel rest.  IV fluid hydration.  We  will monitor her clinically and hopefully this will resolve relatively  rapidly.  Adolph Pollack, M.D.    Kari Baars  D:  02/17/2004  T:  02/18/2004  Job:  [id number]

## 2011-03-27 NOTE — Discharge Summary (Signed)
[facility]  Patient:    Amanda [name], Amanda [name]                      MRN: [id number] Adm. Date:  [id number] Disc. Date: [id number] Attending:  Sharon Mt                           Discharge Summary  HISTORY OF PRESENT ILLNESS:  The patient is a 55 year old female who entered the hospital with symptomatic fibroids for definitive surgery.  HOSPITAL COURSE:  On the day of admission she was taken to the operating room, had a total abdominal hysterectomy which was performed without difficulty. Postoperatively, she had an ileus which responded to restricting fluids and Dulcolax suppositories.  She also ran a low grade temperature that went up to 101 at night.  Her white count was slightly elevated at the time of discharge. Urinalysis at the time of discharge was negative, and urine culture was pending.  DISCHARGE MEDICATIONS:  Tylox for pain relief.  DIET:  Soft diet.  WOUND CARE:  Routine wound care.  She is to call for any temperature elevation greater than 100.4.  FOLLOWUP:  She is to return to our office in 4 weeks.  CONDITION ON DISCHARGE:  Improved.  Final pathology report revealed uterus with multiple leiomyoma.  DISCHARGE DIAGNOSIS:  Leiomyomata uteri with menorrhagia and dysmenorrhea.  OPERATION:  Total abdominal hysterectomy. DD:  11/28/00 TD:  11/29/00 Job: [id number]

## 2011-03-27 NOTE — Op Note (Signed)
NAMEJONDA, Amanda [name]             ACCOUNT NO.:  [id number]   MEDICAL RECORD NO.:  [id number]          PATIENT TYPE:  AMB   LOCATION:  DSC                          FACILITY:  [facility]   PHYSICIAN:  Tennis Must Meyerdierks, M.D.DATE OF BIRTH:  10-06-1956   DATE OF PROCEDURE:  11/07/2004  DATE OF DISCHARGE:                                 OPERATIVE REPORT   PREOPERATIVE DIAGNOSIS:  Left carpal tunnel syndrome.   POSTOPERATIVE DIAGNOSIS:  Left carpal tunnel syndrome.   PROCEDURE:  Decompression of median nerve, left carpal tunnel.   SURGEON:  Lowell Bouton, M.D.   ANESTHESIA:  Marcaine 0.5% local with sedation.   OPERATIVE FINDINGS:  The patient had no masses in the carpal canal.  The  motor branch of the nerve was intact.   PROCEDURE:  Under 0.5% Marcaine local anesthesia with a tourniquet on the  left arm, the left hand was prepped and draped in usual fashion.  After  exsanguinating the limb, the tourniquet was inflated to 250 mmHg.  A 3-cm  longitudinal incision was made in the palm just ulnar to the thenar crease.  Sharp dissection was carried through the subcutaneous tissues.  Blunt  dissection was carried through the superficial palmar fascia distal to the  transverse carpal ligament.  A Hemostat was placed in the carpal canal up  against the hood of the hamate and the transverse carpal ligament was  divided on the ulnar border of the median nerve.  The proximal end of the  ligament was divided with the scissors after dissecting the nerve away from  the undersurface of the ligament.  The carpal canal was then palpated and  was found to be adequately decompressed.  The nerve was examined and the  motor branch was identified.  The wound was irrigated with saline and the  skin was closed with 4-0 nylon sutures.  Sterile dressing were applied,  followed by a volar wrist splint.  The patient tolerated the procedure well  and went to the recovery room, awake and stable, in  good condition.       EMM/MEDQ  D:  11/07/2004  T:  11/07/2004  Job:  [id number]   cc:   Valentino Hue. Magrinat, M.D.  [address] [facility]  [place]  [place] [address]  Fax: [phone]

## 2011-03-27 NOTE — Discharge Summary (Signed)
[name], Amanda [name]                         ACCOUNT NO.:  [id number]   MEDICAL RECORD NO.:  [id number]                   PATIENT TYPE:  INP   LOCATION:  5742                                 FACILITY:  [facility]   PHYSICIAN:  Leonie Man, M.D.                DATE OF BIRTH:  03-06-1956   DATE OF ADMISSION:  02/17/2004  DATE OF DISCHARGE:  02/19/2004                                 DISCHARGE SUMMARY   ADMISSION DIAGNOSES:  Cramping abdominal pain, nausea and vomiting, status  post colon resection.   POSTOPERATIVE DIAGNOSES:  Crampy abdominal pain, nausea and vomiting likely  secondary to Vicodin medications.   HISTORY:  The patient is a 55 year old woman who underwent a sigmoid  colectomy for T3 N0 tumor on April 1st.  She was discharged from the  hospital and doing well 24 hours prior to her readmission.  She began having  some crampy abdominal pain, and some nausea and vomiting after she took some  Vicodin medications.  She had a previous bad reaction from Vicodin, but went  ahead and took the medications anyway.  Followed with admission and with  treatment with some Phenergan she felt well.  She tolerated her regular diet  today.  She had four normal bowel movements without cramping or pain or  bleeding.  She is being discharged  now to be followed up in the office in 2-  3 weeks.   DISCHARGE MEDICATIONS:  Percocet one every four hours p.r.n. for pain.   ACTIVITY:  As tolerated.   DIET:  Unrestricted.                                                Leonie Man, M.D.    PB/MEDQ  D:  02/18/2004  T:  02/19/2004  Job:  [id number]

## 2011-03-27 NOTE — Consult Note (Signed)
Amanda [name]                         ACCOUNT NO.:  [id number]   MEDICAL RECORD NO.:  [id number]                   PATIENT TYPE:  INP   LOCATION:  5711                                 FACILITY:  [facility]   PHYSICIAN:  Valentino Hue. Magrinat, M.D.            DATE OF BIRTH:  19-Jun-1956   DATE OF CONSULTATION:  02/12/2004  DATE OF DISCHARGE:                                   CONSULTATION   Amanda [name] is a 55 year old [place] woman referred by Dr. Lurene Shadow for  evaluation and treatment in the setting of newly diagnosed colon cancer.   The patient noted small amounts of bright red blood per rectum for about six  weeks before having a larger bleed which took her to Dr. Daphine Deutscher for  evaluation. Dr. Daphine Deutscher referred her to Dr. Madilyn Fireman, and he performed a  colonoscopy which showed an adenocarcinoma approximately 18 cm above the  anal verge. With this information, Amanda [name] was referred to Dr. Lurene Shadow,  and after appropriate discussion, he proceeded to sigmoid colectomy with end-  to-end anastomosis on February 08, 2004. The final pathology ([id number]) showed a  grade II colonic adenocarcinoma focally reaching into the pericolonic  adipose tissue with negative margins and 0 of 8 lymph nodes sampled involved  by tumor.   With this information, the patient is referred for further evaluation and  treatment.   PAST MEDICAL HISTORY:  Menometrorrhagia secondary to myomas. She is status  post simple hysterectomy for this. She is status post bilateral tubal  ligation, and she is status post unilateral salpingo-oophorectomy for a  ruptured tube. She had left foot cyst repair at the [facility] some  time ago, and she had a Cesarean section x1.   GYNECOLOGIC HISTORY:  She is G2, P2. As noted, she is status post  hysterectomy. She is not having hot flashes at present.   FAMILY HISTORY:  The patient knows little about her father. Her mother is  alive at age 58. She is the 9th of 10 children (she  is the knee baby).  There is no history of cancer in the family.   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 Occasionally, she also works a part-time  job. She is single. Her two sons are Amanda [name], 28, who works at [employer] in the portable equipment section, and Amanda [name], 19, who is going to  high school and hopes to become a psychiatrist. The patient is a member of  the [employer]. Her fiance's name is Rocky Link.   HEALTH MAINTENANCE:  She is up to date on her Pap smear. She had a mammogram  in March of last year which was unremarkable. She does not smoke or drink  and never did. Her cholesterol is okay. She is up to date on her flu  shots. She does not have a living will  or health care power of attorney but  is interested in completing particularly the health care power of attorney  document, and we will try to facilitate that for her at this admission.   ALLERGIES:  No known drug allergies.   MEDICATIONS:  1. Zocor 50 mg daily.  2. Santura 10 mg daily for bladder spasms.   REVIEW OF SYSTEMS:  She is recovering very nicely from surgery, ambulating  many times a day, eating ice chips, hoping to go to clear liquids tomorrow  morning, but not yet passing gas. Her pain is well controlled, but she is  still on a PCA pump. Despite this, she was very alert tonight and very able  to follow our discussion and ask relevant questions. Aside from the weak  bladder and postoperative problems, her health is good, and she is a very  active person and has a leadership role in her large family. Detailed review  of systems was otherwise unremarkable.   PHYSICAL EXAMINATION:  Her temperature yesterday maximum was 102, currently  it is 98.4. Pulse 103, respiratory rate 18, saturations 96%, and blood  pressure 126/57. A very minimal exam was performed tonight, and a fuller  exam will be performed at the first visit in the  clinic.   LABORATORY DATA:  Lab work obtained this admission includes a white cell  count of 8.4, hemoglobin of 10.9 with a MCV of 88.6 and a platelet count of  606,000 on the day of admission. Her liver function tests showed a SGOT of  68, SGPT of 63, and albumin of 2.9 with a glucose of 108. CEA was less than  5 prior to surgery.   Films:  The patient had a CT of the abdomen and pelvis which showed no  evidence of metastatic disease. She also had a chest x-ray which showed no  evidence of metastatic disease.   IMPRESSION AND PLAN:  A 55 year old [place] woman status post sigmoid  colectomy with implant anastomosis February 08, 2004 for a T3 N0 M0 grade II  adenocarcinoma of the colon with 0 of 8 lymph nodes involved, preoperative  CEA less than 5.   We had a long discussion about cancer in general and colon cancer in  particular. She understands with a stage II-b tumor like hers that her risk  of tumor risk is in the 20% range. It is not standard to treat patients like  her with chemotherapy, but it is my opinion that chemotherapy does lower the  risk by perhaps 30% in cases like hers. This means she would derive an  absolute benefit in the 5% range from chemotherapy. On the other hand, this  means she would have a 95% chance or so of not deriving any benefit from  chemotherapy. This means that if she does choose chemotherapy, we would have  to pick something that would be minimally disruptive of her life, and in  particular, she is very interested in not loosing her hair. One option would  be to do Xeloda orally or we could combine Xeloda orally with Avastin given  by vein every two weeks. I personally feel this combination would be  marginally better, perhaps bringing the risk reduction to the 7% or so  range. These numbers are necessarily speculative since stage II patients generally are folded in with stage III patients in large studies, and it is  difficult to draw definite  conclusions from the smaller sample sizes that  result when you do subgroup analysis  in those studies.   Amanda [name] is going to consider all this. She his going to come to our office  perhaps next week or two weeks from now to chemo school  and get further  information regarding side effects, and then she will return to see me to  make a definitive decision. In any case, the Avastin could not be started  earlier than a month because of risk of bleeding postoperatively. The 5-FU  could be started approximately a month from now if she chose treatment.   It is not clear to me why her liver function tests are mildly elevated. It  could be the Zocor or the other medications, Santura, that she takes (I am  not familiar with it). This is something that will need to be followed.                                               Valentino Hue. Magrinat, M.D.    Ronna Polio  D:  02/12/2004  T:  02/14/2004  Job:  [id number]   cc:   Leonie Man, M.D.  [address]  [place]  [place] [address]  Fax: [phone]   Everardo All. Madilyn Fireman, M.D.  [address]  [place]  [place] [address]  Fax: [phone]   Cephas Darby. Daphine Deutscher, M.D.  [address]  [place]  [place] [address]  Fax: [phone]   Rande Brunt. Eda Paschal, M.D.  [address], [address]  [place]  [place] [address]  Fax: [phone]

## 2011-03-27 NOTE — Op Note (Signed)
Amanda [name], Amanda [name]                         ACCOUNT NO.:  [id number]   MEDICAL RECORD NO.:  [id number]                   PATIENT TYPE:  AMB   LOCATION:  ENDO                                 FACILITY:  [facility]   PHYSICIAN:  John C. Madilyn Fireman, M.D.                 DATE OF BIRTH:  Dec 05, 1955   DATE OF PROCEDURE:  01/17/2004  DATE OF DISCHARGE:                                 OPERATIVE REPORT   PROCEDURE PERFORMED:  Colonoscopy with biopsy.   ENDOSCOPIST:  Barrie Folk, M.D.   INDICATIONS FOR PROCEDURE:  Rectal bleeding in a 55 year old patient.   DESCRIPTION OF PROCEDURE:  The patient was placed in the left lateral  decubitus position and placed on the pulse monitor with continuous low-flow  oxygen delivered by nasal cannula.  The patient was sedated with 50 mcg of  IV fentanyl and 6 mg of IV Versed.  The Olympus video colonoscope was  inserted into the rectum and advanced to the cecum, confirmed by  transillumination of McBurney's point and visualization of the ileocecal  valve and appendiceal orifice.  The prep was excellent.  The cecum,  ascending, transverse and descending colon and proximal sigmoid colon  appeared normal with no masses, polyps, diverticula or other mucosal  abnormalities.  Near the rectosigmoid junction from about 18 to 22 cm there  was a bulky friable sessile mass occupying approximately 40% of the  circumference of the bowel wall at this level.  It had a typical appearance  for adenocarcinoma.  Biopsies were taken.  The rectal mucosa distal to this  appeared normal with no further polyps, masses, diverticula or mucosal  abnormalities.  The scope was then withdrawn and the patient returned to the  recovery room in stable condition.  She tolerated the procedure well.  There  were no immediate complications.   IMPRESSION:  Rectosigmoid mass, likely carcinoma.   PLAN:  Await biopsy results.  Will need preop evaluation and surgical  consultation.                                  John C. Madilyn Fireman, M.D.    JCH/MEDQ  D:  01/17/2004  T:  01/18/2004  Job:  [id number]   cc:   Tanya D. Daphine Deutscher, M.D.  [address]  [place]  [place] [address]  Fax: [phone]

## 2011-03-27 NOTE — Discharge Summary (Signed)
Amanda [name], Amanda [name]                         ACCOUNT NO.:  [id number]   MEDICAL RECORD NO.:  [id number]                   PATIENT TYPE:  INP   LOCATION:  5711                                 FACILITY:  [facility]   PHYSICIAN:  Leonie Man, M.D.                DATE OF BIRTH:  [birth year]   DATE OF ADMISSION:  02/08/2004  DATE OF DISCHARGE:  02/16/2004                                 DISCHARGE SUMMARY   ADMISSION DIAGNOSIS:  Carcinoma of the rectosigmoid.   DISCHARGE DIAGNOSIS:  Carcinoma of the rectosigmoid.   PROCEDURES IN THE HOSPITAL:  A sigmoid colectomy.   COMPLICATIONS:  None.   CONDITION ON DISCHARGE:  Improved.   PATHOLOGIC REPORTS:  Invasive, moderately differentiated colonic  adenocarcinoma invading into the muscularis and focally into the pericolonic  adipose tissue.  Margins of resection are free of tumor.  Eight pericolonic  lymph nodes with no evidence of metastatic carcinoma.  This is a T3 N0 M0  tumor.   HOSPITAL COURSE:  Amanda [name] was admitted to the hospital and subsequently  underwent an expiratory laparotomy and rectosigmoid colectomy.  Her  postoperative course has been benign with normal resumption of bowel  activity and diet.  She was seen in consultation by Dr. Ruthann Cancer and  will followup with him in the next few days.  At the time of discharge she  is feeling well, tolerating a regular diet and having normal bowel  movements.  She is being discharged now to be followed up in our office in  approximately two to three weeks.   DISCHARGE MEDICATIONS:  Percocet one to two every four hours p.r.n. for  pain.   ACTIVITY:  Restricted to lifting no more than 15 pounds over the next  several weeks.  She has no dietary restrictions.                                                Leonie Man, M.D.    PB/MEDQ  D:  02/16/2004  T:  02/17/2004  Job:  [id number]   cc:   Valentino Hue. Magrinat, M.D.  [address] [facility]  [place]  [place] [address]  Fax:  [phone]   Kenney Houseman D. Daphine Deutscher, M.D.  [address]  [place]  [place] [address]  Fax: [phone]   Everardo All. Madilyn Fireman, M.D.  [address]  [place]  [place] [address]  Fax: [phone]

## 2011-03-27 NOTE — Op Note (Signed)
[name], [name]             ACCOUNT NO.:  [id number]   MEDICAL RECORD NO.:  [id number]          PATIENT TYPE:  OIB   LOCATION:  5730                         FACILITY:  [facility]   PHYSICIAN:  Leonie Man, M.D.   DATE OF BIRTH:  [birth year]   DATE OF PROCEDURE:  10/15/2004  DATE OF DISCHARGE:                                 OPERATIVE REPORT   PREOPERATIVE DIAGNOSIS:  Ventral hernia.   POSTOPERATIVE DIAGNOSIS:  Ventral hernia.   PROCEDURE:  Repair of ventral hernia with mesh.   SURGEON:  Leonie Man, M.D.   ASSISTANT:  Nurse.   ANESTHESIA:  General.   [name] is a 55 year old woman status post rectosigmoid resection for  carcinoma earlier this year.  She developed a ventral hernia extending  around the region of the umbilicus and extending up into the epigastrium and  reflecting somewhat to the right.  We delayed her repair until such time as  she had completed her chemotherapy.  She comes to the operating room now for  repair.  After the risks and potential benefits of surgery have been  discussed, all questions were answered and consent obtained.   PROCEDURE:  Following the induction of satisfactory general anesthesia, with  the patient positioned supinely, the abdomen was prepped and draped to be  included in the sterile operative field.  The old midline incision was  opened up below the umbilicus and extended up above the umbilicus in the  region of the hernia.  The dissection was carried down.  The hernia sac was  dissected free on all sides until the fascia was obtained.  The redundant  hernia sac was amputated and forwarded for pathologic diagnosis.  The edges  of the hernia were then cleared and dissection carried out laterally and  inferiorly and superiorly so as to expose the fascia.  The defect was closed  with a running suture of #1 Novofil, and then an onlay patch of  polypropylene mesh was sewn in extending to 3-4 cm beyond the edges of the  defect,  and these were sutured in with interrupted sutures of 0 Novofil  placed at the 12 o'clock, 6 o'clock, 3 o'clock, 9 o'clock, 8 o'clock, 2  o'clock, 10 o'clock, and 4 o'clock positions.  I then use a laparoscopic  tacking device to suture the remainder of the edge of the mesh into the  fascia.  The wound was then irrigated.  Sponge, instruments, and sharp  counts were verified.  The subcutaneous tissues were then closed with a  running suture of 2-0 Vicryl after a 19 [place] Blake drain was placed onto  the flaps.  The wound was then closed with a running suture of 4-0  Monocryl and reinforced with Steri-Strips.  Sponge, instruments, and sharp  counts were again verified.  Sterile dressings applied.  The anesthetic was  reversed, and the patient was moved from the operating room to the recovery  room in stable condition.  She tolerated the procedure well.      Patr   PB/MEDQ  D:  10/15/2004  T:  10/15/2004  Job:  [id number]  cc:   Leonie Man, M.D.  [address]  [address]  [place]  [place] [address]  Fax: [phone]

## 2011-09-23 ENCOUNTER — Other Ambulatory Visit: Payer: Self-pay | Admitting: Obstetrics and Gynecology

## 2011-09-23 DIAGNOSIS — Z1231 Encounter for screening mammogram for malignant neoplasm of breast: Secondary | ICD-10-CM

## 2011-10-23 ENCOUNTER — Ambulatory Visit (HOSPITAL_COMMUNITY)
Admission: RE | Admit: 2011-10-23 | Discharge: 2011-10-23 | Disposition: A | Discharge status: Home / Self Care | Payer: 59 | Source: Ambulatory Visit | Attending: Obstetrics and Gynecology | Admitting: Obstetrics and Gynecology

## 2011-10-23 DIAGNOSIS — Z1231 Encounter for screening mammogram for malignant neoplasm of breast: Secondary | ICD-10-CM | POA: Insufficient documentation

## 2012-08-23 ENCOUNTER — Other Ambulatory Visit: Payer: Self-pay | Admitting: Obstetrics and Gynecology

## 2012-08-23 ENCOUNTER — Other Ambulatory Visit (HOSPITAL_COMMUNITY): Payer: Self-pay | Admitting: Family Medicine

## 2012-08-23 DIAGNOSIS — Z1231 Encounter for screening mammogram for malignant neoplasm of breast: Secondary | ICD-10-CM

## 2012-10-24 ENCOUNTER — Ambulatory Visit (HOSPITAL_COMMUNITY)
Admission: RE | Admit: 2012-10-24 | Discharge: 2012-10-24 | Disposition: A | Discharge status: Home / Self Care | Payer: 59 | Source: Ambulatory Visit | Attending: Obstetrics and Gynecology | Admitting: Obstetrics and Gynecology

## 2012-10-24 DIAGNOSIS — Z1231 Encounter for screening mammogram for malignant neoplasm of breast: Secondary | ICD-10-CM

## 2013-01-07 HISTORY — PX: SHOULDER ARTHROSCOPY: SHX128

## 2013-07-30 ENCOUNTER — Other Ambulatory Visit: Payer: Self-pay | Admitting: Family Medicine

## 2013-08-05 ENCOUNTER — Telehealth: Payer: Self-pay

## 2013-08-05 NOTE — Telephone Encounter (Signed)
I have chart. Will call in meds.

## 2013-08-05 NOTE — Telephone Encounter (Signed)
Patient called requesting that Dr. Elbert Ewings fulfill her prescription for "Flexoril". Patient stated that it had to be approved first due to workers comp.

## 2013-08-14 ENCOUNTER — Other Ambulatory Visit (HOSPITAL_COMMUNITY): Payer: Self-pay | Admitting: Family Medicine

## 2013-08-14 DIAGNOSIS — Z1231 Encounter for screening mammogram for malignant neoplasm of breast: Secondary | ICD-10-CM

## 2013-10-25 ENCOUNTER — Ambulatory Visit (HOSPITAL_COMMUNITY)
Admission: RE | Admit: 2013-10-25 | Discharge: 2013-10-25 | Disposition: A | Discharge status: Home / Self Care | Payer: 59 | Source: Ambulatory Visit | Attending: Family Medicine | Admitting: Family Medicine

## 2013-10-25 DIAGNOSIS — Z1231 Encounter for screening mammogram for malignant neoplasm of breast: Secondary | ICD-10-CM

## 2014-06-12 ENCOUNTER — Other Ambulatory Visit (HOSPITAL_COMMUNITY): Payer: Self-pay | Admitting: Family Medicine

## 2014-06-12 DIAGNOSIS — Z1231 Encounter for screening mammogram for malignant neoplasm of breast: Secondary | ICD-10-CM

## 2014-10-26 ENCOUNTER — Ambulatory Visit (HOSPITAL_COMMUNITY)
Admission: RE | Admit: 2014-10-26 | Discharge: 2014-10-26 | Disposition: A | Discharge status: Home / Self Care | Payer: BC Managed Care – PPO | Source: Ambulatory Visit | Attending: Family Medicine | Admitting: Family Medicine

## 2014-10-26 DIAGNOSIS — Z1231 Encounter for screening mammogram for malignant neoplasm of breast: Secondary | ICD-10-CM | POA: Insufficient documentation

## 2014-11-06 ENCOUNTER — Other Ambulatory Visit: Payer: Self-pay | Admitting: Neurosurgery

## 2014-11-15 NOTE — Pre-Procedure Instructions (Signed)
Geneal Oates  11/15/2014   Your procedure is scheduled on:  Fri, Jan 15 @ 12:40 PM  Report to [facility]  at 9:45 AM.  Call this number if you have problems the morning of surgery: [phone]   Remember:   Do not eat food or drink liquids after midnight.   Take these medicines the morning of surgery with A SIP OF WATER: Zyrtec(Cetirizine-if needed)              Stop taking your Naproxen and Ibuprofen. No Goody's,BC's,Aleve,Aspirin,Fish Oil,or any Herbal Medications   Do not wear jewelry, make-up or nail polish.  Do not wear lotions, powders, or perfumes. You may wear deodorant.  Do not shave 48 hours prior to surgery.   Do not bring valuables to the hospital.  [facility] is not responsible                  for any belongings or valuables.               Contacts, dentures or bridgework may not be worn into surgery.  Leave suitcase in the car. After surgery it may be brought to your room.  For patients admitted to the hospital, discharge time is determined by your                treatment team.               Patients discharged the day of surgery will not be allowed to drive  home.    Special Instructions:  [place] - Preparing for Surgery  Before surgery, you can play an important role.  Because skin is not sterile, your skin needs to be as free of germs as possible.  You can reduce the number of germs on you skin by washing with CHG (chlorahexidine gluconate) soap before surgery.  CHG is an antiseptic cleaner which kills germs and bonds with the skin to continue killing germs even after washing.  Please DO NOT use if you have an allergy to CHG or antibacterial soaps.  If your skin becomes reddened/irritated stop using the CHG and inform your nurse when you arrive at Short Stay.  Do not shave (including legs and underarms) for at least 48 hours prior to the first CHG shower.  You may shave your face.  Please follow these instructions carefully:   1.  Shower with  CHG Soap the night before surgery and the                                morning of Surgery.  2.  If you choose to wash your hair, wash your hair first as usual with your       normal shampoo.  3.  After you shampoo, rinse your hair and body thoroughly to remove the                      Shampoo.  4.  Use CHG as you would any other liquid soap.  You can apply chg directly       to the skin and wash gently with scrungie or a clean washcloth.  5.  Apply the CHG Soap to your body ONLY FROM THE NECK DOWN.        Do not use on open wounds or open sores.  Avoid contact with your eyes,       ears, mouth  and genitals (private parts).  Wash genitals (private parts)       with your normal soap.  6.  Wash thoroughly, paying special attention to the area where your surgery        will be performed.  7.  Thoroughly rinse your body with warm water from the neck down.  8.  DO NOT shower/wash with your normal soap after using and rinsing off       the CHG Soap.  9.  Pat yourself dry with a clean towel.            10.  Wear clean pajamas.            11.  Place clean sheets on your bed the night of your first shower and do not        sleep with pets.  Day of Surgery  Do not apply any lotions/deoderants the morning of surgery.  Please wear clean clothes to the hospital/surgery center.     Please read over the following fact sheets that you were given: Pain Booklet, Coughing and Deep Breathing, MRSA Information and Surgical Site Infection Prevention

## 2014-11-16 ENCOUNTER — Encounter (HOSPITAL_COMMUNITY)
Admission: RE | Admit: 2014-11-16 | Discharge: 2014-11-16 | Disposition: A | Discharge status: Home / Self Care | Payer: Worker's Compensation | Source: Ambulatory Visit | Attending: Neurosurgery | Admitting: Neurosurgery

## 2014-11-16 ENCOUNTER — Encounter (HOSPITAL_COMMUNITY): Payer: Self-pay

## 2014-11-16 DIAGNOSIS — Z01812 Encounter for preprocedural laboratory examination: Secondary | ICD-10-CM | POA: Diagnosis not present

## 2014-11-16 HISTORY — DX: Sickle-cell trait: D57.3

## 2014-11-16 HISTORY — DX: Sleep apnea, unspecified: G47.30

## 2014-11-16 HISTORY — DX: Headache, unspecified: R51.9

## 2014-11-16 HISTORY — DX: Unspecified osteoarthritis, unspecified site: M19.90

## 2014-11-16 HISTORY — DX: Malignant (primary) neoplasm, unspecified: C80.1

## 2014-11-16 HISTORY — DX: Headache: R51

## 2014-11-16 LAB — CBC
HCT: 26.8 % — ABNORMAL LOW (ref 36.0–46.0)
Hemoglobin: 8.1 g/dL — ABNORMAL LOW (ref 12.0–15.0)
MCH: 25.8 pg — AB (ref 26.0–34.0)
MCHC: 30.2 g/dL (ref 30.0–36.0)
MCV: 85.4 fL (ref 78.0–100.0)
PLATELETS: 303 10*3/uL (ref 150–400)
RBC: 3.14 MIL/uL — ABNORMAL LOW (ref 3.87–5.11)
RDW: 19.6 % — ABNORMAL HIGH (ref 11.5–15.5)
WBC: 8 10*3/uL (ref 4.0–10.5)

## 2014-11-16 LAB — BASIC METABOLIC PANEL
Anion gap: 7 (ref 5–15)
BUN: 10 mg/dL (ref 6–23)
CALCIUM: 9.5 mg/dL (ref 8.4–10.5)
CO2: 27 mmol/L (ref 19–32)
Chloride: 105 mEq/L (ref 96–112)
Creatinine, Ser: 0.77 mg/dL (ref 0.50–1.10)
GFR calc Af Amer: >90 mL/min (ref >90)
GFR calc non Af Amer: >90 mL/min (ref >90)
Glucose, Bld: 98 mg/dL (ref 70–99)
POTASSIUM: 4 mmol/L (ref 3.5–5.1)
Sodium: 139 mmol/L (ref 135–145)

## 2014-11-16 LAB — SURGICAL PCR SCREEN
MRSA, PCR: NEGATIVE
STAPHYLOCOCCUS AUREUS: NEGATIVE

## 2014-11-16 NOTE — Progress Notes (Signed)
Pt. Followed by Dr. Chauncey Reading for primary care, pt. Reports that she hasn't had an ekg in many yrs., denies cardiac history, denies chest pain, changes in breathing. Current complaint is some sinus / cold like symptoms.  Pt. Currently using ear drops, also taking Nyquil & benadryl.

## 2014-11-22 MED ORDER — CEFAZOLIN SODIUM-DEXTROSE 2-3 GM-% IV SOLR
2.0000 g | INTRAVENOUS | Status: AC
  Administered 2014-11-23: 2 g via INTRAVENOUS
  Filled 2014-11-22: qty 50

## 2014-11-23 ENCOUNTER — Encounter (HOSPITAL_COMMUNITY)
Admission: RE | Disposition: A | Discharge status: Home / Self Care | Payer: Self-pay | Source: Ambulatory Visit | Attending: Neurosurgery

## 2014-11-23 ENCOUNTER — Encounter (HOSPITAL_COMMUNITY): Payer: Self-pay | Admitting: *Deleted

## 2014-11-23 ENCOUNTER — Inpatient Hospital Stay (HOSPITAL_COMMUNITY): Payer: Worker's Compensation

## 2014-11-23 ENCOUNTER — Inpatient Hospital Stay (HOSPITAL_COMMUNITY): Payer: Worker's Compensation | Admitting: Certified Registered"

## 2014-11-23 ENCOUNTER — Ambulatory Visit (HOSPITAL_COMMUNITY)
Admission: RE | Admit: 2014-11-23 | Discharge: 2014-11-24 | Disposition: A | Discharge status: Home / Self Care | Payer: Worker's Compensation | Source: Ambulatory Visit | Attending: Neurosurgery | Admitting: Neurosurgery

## 2014-11-23 DIAGNOSIS — R51 Headache: Secondary | ICD-10-CM | POA: Insufficient documentation

## 2014-11-23 DIAGNOSIS — M1990 Unspecified osteoarthritis, unspecified site: Secondary | ICD-10-CM | POA: Diagnosis not present

## 2014-11-23 DIAGNOSIS — M4722 Other spondylosis with radiculopathy, cervical region: Secondary | ICD-10-CM | POA: Diagnosis not present

## 2014-11-23 DIAGNOSIS — G4730 Sleep apnea, unspecified: Secondary | ICD-10-CM | POA: Insufficient documentation

## 2014-11-23 DIAGNOSIS — M4802 Spinal stenosis, cervical region: Secondary | ICD-10-CM

## 2014-11-23 DIAGNOSIS — M47812 Spondylosis without myelopathy or radiculopathy, cervical region: Secondary | ICD-10-CM | POA: Diagnosis present

## 2014-11-23 DIAGNOSIS — Z419 Encounter for procedure for purposes other than remedying health state, unspecified: Secondary | ICD-10-CM

## 2014-11-23 DIAGNOSIS — D573 Sickle-cell trait: Secondary | ICD-10-CM | POA: Diagnosis not present

## 2014-11-23 HISTORY — PX: ANTERIOR CERVICAL DECOMP/DISCECTOMY FUSION: SHX1161

## 2014-11-23 HISTORY — DX: Anemia, unspecified: D64.9

## 2014-11-23 LAB — ABO/RH: ABO/RH(D): A POS

## 2014-11-23 LAB — TYPE AND SCREEN
ABO/RH(D): A POS
Antibody Screen: NEGATIVE

## 2014-11-23 LAB — HEMOGLOBIN AND HEMATOCRIT, BLOOD
HCT: 34.7 % — ABNORMAL LOW (ref 36.0–46.0)
Hemoglobin: 11.6 g/dL — ABNORMAL LOW (ref 12.0–15.0)

## 2014-11-23 SURGERY — ANTERIOR CERVICAL DECOMPRESSION/DISCECTOMY FUSION 3 LEVELS
Anesthesia: General | Site: Neck

## 2014-11-23 MED ORDER — OXYCODONE HCL 5 MG/5ML PO SOLN: 5.0000 mg | Freq: Once | ORAL | Status: DC | PRN

## 2014-11-23 MED ORDER — ACETAMINOPHEN 325 MG PO TABS: 650.0000 mg | ORAL_TABLET | ORAL | Status: DC | PRN

## 2014-11-23 MED ORDER — HYDROMORPHONE HCL 1 MG/ML IJ SOLN
INTRAMUSCULAR | Status: AC
  Administered 2014-11-23: 0.5 mg via INTRAVENOUS
  Filled 2014-11-23: qty 1

## 2014-11-23 MED ORDER — ROCURONIUM BROMIDE 50 MG/5ML IV SOLN
INTRAVENOUS | Status: AC
  Filled 2014-11-23: qty 1

## 2014-11-23 MED ORDER — DEXAMETHASONE SODIUM PHOSPHATE 10 MG/ML IJ SOLN
INTRAMUSCULAR | Status: DC | PRN
  Administered 2014-11-23: 10 mg via INTRAVENOUS

## 2014-11-23 MED ORDER — NEOSTIGMINE METHYLSULFATE 10 MG/10ML IV SOLN
INTRAVENOUS | Status: AC
Start: 2014-11-23 — End: 2014-11-23
  Filled 2014-11-23: qty 1

## 2014-11-23 MED ORDER — PANTOPRAZOLE SODIUM 40 MG IV SOLR
40.0000 mg | Freq: Every day | INTRAVENOUS | Status: DC
  Administered 2014-11-23: 40 mg via INTRAVENOUS
  Filled 2014-11-23 (×2): qty 40

## 2014-11-23 MED ORDER — CHOLECALCIFEROL 10 MCG (400 UNIT) PO TABS
400.0000 [IU] | ORAL_TABLET | Freq: Every day | ORAL | Status: DC
  Administered 2014-11-24: 400 [IU] via ORAL
  Filled 2014-11-23: qty 1

## 2014-11-23 MED ORDER — ADVANCED E 400 UNITS PO CAPS: 1.0000 | ORAL_CAPSULE | Freq: Every day | ORAL | Status: DC

## 2014-11-23 MED ORDER — LACTATED RINGERS IV SOLN
INTRAVENOUS | Status: DC
  Administered 2014-11-23 (×2): via INTRAVENOUS

## 2014-11-23 MED ORDER — HYDROCODONE-ACETAMINOPHEN 5-325 MG PO TABS: 1.0000 | ORAL_TABLET | ORAL | Status: DC | PRN

## 2014-11-23 MED ORDER — ONDANSETRON HCL 4 MG/2ML IJ SOLN: 4.0000 mg | INTRAMUSCULAR | Status: DC | PRN

## 2014-11-23 MED ORDER — SENNA 8.6 MG PO TABS
1.0000 | ORAL_TABLET | Freq: Two times a day (BID) | ORAL | Status: DC
  Administered 2014-11-24: 8.6 mg via ORAL
  Filled 2014-11-23 (×2): qty 1

## 2014-11-23 MED ORDER — FLEET ENEMA 7-19 GM/118ML RE ENEM
1.0000 | ENEMA | Freq: Once | RECTAL | Status: AC | PRN
  Filled 2014-11-23: qty 1

## 2014-11-23 MED ORDER — CEFAZOLIN SODIUM 1-5 GM-% IV SOLN
1.0000 g | Freq: Three times a day (TID) | INTRAVENOUS | Status: AC
  Administered 2014-11-23 – 2014-11-24 (×2): 1 g via INTRAVENOUS
  Filled 2014-11-23 (×2): qty 50

## 2014-11-23 MED ORDER — CALCIUM CARBONATE-VITAMIN D 500-200 MG-UNIT PO TABS
1.0000 | ORAL_TABLET | Freq: Every day | ORAL | Status: DC
  Filled 2014-11-23: qty 1

## 2014-11-23 MED ORDER — PHENOL 1.4 % MT LIQD
1.0000 | OROMUCOSAL | Status: DC | PRN
  Filled 2014-11-23: qty 177

## 2014-11-23 MED ORDER — BUPIVACAINE HCL (PF) 0.5 % IJ SOLN
INTRAMUSCULAR | Status: DC | PRN
  Administered 2014-11-23: 5 mL

## 2014-11-23 MED ORDER — METHOCARBAMOL 500 MG PO TABS: 500.0000 mg | ORAL_TABLET | Freq: Four times a day (QID) | ORAL | Status: DC | PRN

## 2014-11-23 MED ORDER — CYCLOBENZAPRINE HCL 10 MG PO TABS
10.0000 mg | ORAL_TABLET | Freq: Three times a day (TID) | ORAL | Status: DC | PRN
  Administered 2014-11-23: 10 mg via ORAL
  Filled 2014-11-23: qty 1

## 2014-11-23 MED ORDER — OXYCODONE-ACETAMINOPHEN 5-325 MG PO TABS
ORAL_TABLET | ORAL | Status: AC
  Administered 2014-11-23: 2 via ORAL
  Filled 2014-11-23: qty 2

## 2014-11-23 MED ORDER — GLYCOPYRROLATE 0.2 MG/ML IJ SOLN
INTRAMUSCULAR | Status: AC
  Filled 2014-11-23: qty 4

## 2014-11-23 MED ORDER — 0.9 % SODIUM CHLORIDE (POUR BTL) OPTIME
TOPICAL | Status: DC | PRN
  Administered 2014-11-23: 1000 mL

## 2014-11-23 MED ORDER — PHENYLEPHRINE HCL 10 MG/ML IJ SOLN
10.0000 mg | INTRAVENOUS | Status: DC | PRN
  Administered 2014-11-23: 15 ug/min via INTRAVENOUS

## 2014-11-23 MED ORDER — MENTHOL 3 MG MT LOZG: 1.0000 | LOZENGE | OROMUCOSAL | Status: DC | PRN

## 2014-11-23 MED ORDER — MENTHOL 3 MG MT LOZG
LOZENGE | OROMUCOSAL | Status: AC
  Filled 2014-11-23: qty 9

## 2014-11-23 MED ORDER — DIAZEPAM 5 MG PO TABS
ORAL_TABLET | ORAL | Status: AC
Start: 2014-11-23 — End: 2014-11-23
  Administered 2014-11-23: 5 mg via ORAL
  Filled 2014-11-23: qty 1

## 2014-11-23 MED ORDER — DOCUSATE SODIUM 100 MG PO CAPS
100.0000 mg | ORAL_CAPSULE | Freq: Two times a day (BID) | ORAL | Status: DC
  Administered 2014-11-23: 100 mg via ORAL
  Filled 2014-11-23: qty 1

## 2014-11-23 MED ORDER — HYDROMORPHONE HCL 1 MG/ML IJ SOLN
0.2500 mg | INTRAMUSCULAR | Status: DC | PRN
  Administered 2014-11-23 (×4): 0.5 mg via INTRAVENOUS

## 2014-11-23 MED ORDER — LIDOCAINE HCL (CARDIAC) 20 MG/ML IV SOLN
INTRAVENOUS | Status: AC
  Filled 2014-11-23: qty 5

## 2014-11-23 MED ORDER — ONDANSETRON HCL 4 MG/2ML IJ SOLN
INTRAMUSCULAR | Status: AC
  Filled 2014-11-23: qty 4

## 2014-11-23 MED ORDER — LORATADINE 10 MG PO TABS
10.0000 mg | ORAL_TABLET | Freq: Every day | ORAL | Status: DC
  Administered 2014-11-24: 10 mg via ORAL
  Filled 2014-11-23: qty 1

## 2014-11-23 MED ORDER — MIDAZOLAM HCL 2 MG/2ML IJ SOLN
INTRAMUSCULAR | Status: AC
  Filled 2014-11-23: qty 2

## 2014-11-23 MED ORDER — SURGIFOAM 100 EX MISC
CUTANEOUS | Status: DC | PRN
  Administered 2014-11-23: 13:00:00 via TOPICAL

## 2014-11-23 MED ORDER — ONDANSETRON HCL 4 MG/2ML IJ SOLN
INTRAMUSCULAR | Status: DC | PRN
  Administered 2014-11-23: 4 mg via INTRAVENOUS

## 2014-11-23 MED ORDER — FOLIC ACID 1 MG PO TABS
1.0000 mg | ORAL_TABLET | Freq: Every day | ORAL | Status: DC
  Administered 2014-11-24: 1 mg via ORAL
  Filled 2014-11-23: qty 1

## 2014-11-23 MED ORDER — OXYCODONE-ACETAMINOPHEN 5-325 MG PO TABS
1.0000 | ORAL_TABLET | ORAL | Status: DC | PRN
  Administered 2014-11-23 – 2014-11-24 (×4): 2 via ORAL
  Filled 2014-11-23 (×3): qty 2

## 2014-11-23 MED ORDER — LIDOCAINE HCL (CARDIAC) 20 MG/ML IV SOLN
INTRAVENOUS | Status: DC | PRN
  Administered 2014-11-23: 60 mg via INTRAVENOUS

## 2014-11-23 MED ORDER — MORPHINE SULFATE 2 MG/ML IJ SOLN
1.0000 mg | INTRAMUSCULAR | Status: DC | PRN
  Filled 2014-11-23: qty 1

## 2014-11-23 MED ORDER — THROMBIN 5000 UNITS EX SOLR
OROMUCOSAL | Status: DC | PRN
  Administered 2014-11-23: 13:00:00 via TOPICAL

## 2014-11-23 MED ORDER — POLYETHYLENE GLYCOL 3350 17 G PO PACK
17.0000 g | PACK | Freq: Every day | ORAL | Status: DC | PRN
  Filled 2014-11-23: qty 1

## 2014-11-23 MED ORDER — FENTANYL CITRATE 0.05 MG/ML IJ SOLN
INTRAMUSCULAR | Status: DC | PRN
  Administered 2014-11-23: 100 ug via INTRAVENOUS
  Administered 2014-11-23: 150 ug via INTRAVENOUS
  Administered 2014-11-23: 50 ug via INTRAVENOUS
  Administered 2014-11-23: 100 ug via INTRAVENOUS

## 2014-11-23 MED ORDER — DIPHENHYDRAMINE HCL 25 MG PO CAPS: 25.0000 mg | ORAL_CAPSULE | Freq: Four times a day (QID) | ORAL | Status: DC | PRN

## 2014-11-23 MED ORDER — GLYCOPYRROLATE 0.2 MG/ML IJ SOLN
INTRAMUSCULAR | Status: DC | PRN
  Administered 2014-11-23: 0.4 mg via INTRAVENOUS

## 2014-11-23 MED ORDER — KCL IN DEXTROSE-NACL 20-5-0.45 MEQ/L-%-% IV SOLN
INTRAVENOUS | Status: DC
  Filled 2014-11-23 (×3): qty 1000

## 2014-11-23 MED ORDER — SODIUM CHLORIDE 0.9 % IJ SOLN: 3.0000 mL | INTRAMUSCULAR | Status: DC | PRN

## 2014-11-23 MED ORDER — VITAMIN B-12 1000 MCG PO TABS
1000.0000 ug | ORAL_TABLET | Freq: Every day | ORAL | Status: DC
  Administered 2014-11-24: 1000 ug via ORAL
  Filled 2014-11-23: qty 1

## 2014-11-23 MED ORDER — PROPOFOL 10 MG/ML IV BOLUS
INTRAVENOUS | Status: DC | PRN
  Administered 2014-11-23: 150 mg via INTRAVENOUS
  Administered 2014-11-23: 50 mg via INTRAVENOUS

## 2014-11-23 MED ORDER — ALUM & MAG HYDROXIDE-SIMETH 200-200-20 MG/5ML PO SUSP: 30.0000 mL | Freq: Four times a day (QID) | ORAL | Status: DC | PRN

## 2014-11-23 MED ORDER — FENTANYL CITRATE 0.05 MG/ML IJ SOLN
INTRAMUSCULAR | Status: AC
Start: 2014-11-23 — End: 2014-11-23
  Filled 2014-11-23: qty 5

## 2014-11-23 MED ORDER — ACETAMINOPHEN 160 MG/5ML PO SOLN
325.0000 mg | ORAL | Status: DC | PRN
  Filled 2014-11-23: qty 20.3

## 2014-11-23 MED ORDER — ACETAMINOPHEN 650 MG RE SUPP: 650.0000 mg | RECTAL | Status: DC | PRN

## 2014-11-23 MED ORDER — ADULT MULTIVITAMIN W/MINERALS CH
1.0000 | ORAL_TABLET | Freq: Every day | ORAL | Status: DC
  Filled 2014-11-23: qty 1

## 2014-11-23 MED ORDER — ACETAMINOPHEN 500 MG PO TABS: 500.0000 mg | ORAL_TABLET | Freq: Four times a day (QID) | ORAL | Status: DC | PRN

## 2014-11-23 MED ORDER — LIDOCAINE-EPINEPHRINE 1 %-1:100000 IJ SOLN
INTRAMUSCULAR | Status: DC | PRN
  Administered 2014-11-23: 5 mL

## 2014-11-23 MED ORDER — BISACODYL 10 MG RE SUPP: 10.0000 mg | Freq: Every day | RECTAL | Status: DC | PRN

## 2014-11-23 MED ORDER — ROCURONIUM BROMIDE 100 MG/10ML IV SOLN
INTRAVENOUS | Status: DC | PRN
  Administered 2014-11-23: 50 mg via INTRAVENOUS

## 2014-11-23 MED ORDER — OXYCODONE HCL 5 MG PO TABS: 5.0000 mg | ORAL_TABLET | Freq: Once | ORAL | Status: DC | PRN

## 2014-11-23 MED ORDER — ACETAMINOPHEN 325 MG PO TABS: 325.0000 mg | ORAL_TABLET | ORAL | Status: DC | PRN

## 2014-11-23 MED ORDER — PHENYLEPHRINE HCL 10 MG/ML IJ SOLN
INTRAMUSCULAR | Status: DC | PRN
  Administered 2014-11-23 (×2): 80 ug via INTRAVENOUS
  Administered 2014-11-23: 40 ug via INTRAVENOUS
  Administered 2014-11-23: 80 ug via INTRAVENOUS

## 2014-11-23 MED ORDER — SODIUM CHLORIDE 0.9 % IJ SOLN
3.0000 mL | Freq: Two times a day (BID) | INTRAMUSCULAR | Status: DC
  Administered 2014-11-23: 3 mL via INTRAVENOUS

## 2014-11-23 MED ORDER — SIMVASTATIN 20 MG PO TABS
20.0000 mg | ORAL_TABLET | Freq: Every day | ORAL | Status: DC
  Administered 2014-11-23: 20 mg via ORAL
  Filled 2014-11-23 (×2): qty 1

## 2014-11-23 MED ORDER — NEOMYCIN-POLYMYXIN-HC 3.5-10000-1 OT SOLN
4.0000 [drp] | Freq: Four times a day (QID) | OTIC | Status: DC
  Filled 2014-11-23: qty 10

## 2014-11-23 MED ORDER — DIAZEPAM 5 MG PO TABS
5.0000 mg | ORAL_TABLET | Freq: Four times a day (QID) | ORAL | Status: DC | PRN
  Administered 2014-11-23: 5 mg via ORAL

## 2014-11-23 MED ORDER — PROPOFOL 10 MG/ML IV BOLUS
INTRAVENOUS | Status: AC
  Filled 2014-11-23: qty 20

## 2014-11-23 MED ORDER — ZOLPIDEM TARTRATE 5 MG PO TABS: 5.0000 mg | ORAL_TABLET | Freq: Every evening | ORAL | Status: DC | PRN

## 2014-11-23 MED ORDER — ALBUMIN HUMAN 5 % IV SOLN
INTRAVENOUS | Status: DC | PRN
  Administered 2014-11-23: 13:00:00 via INTRAVENOUS

## 2014-11-23 MED ORDER — FENTANYL CITRATE 0.05 MG/ML IJ SOLN
INTRAMUSCULAR | Status: AC
  Filled 2014-11-23: qty 5

## 2014-11-23 MED ORDER — NEOSTIGMINE METHYLSULFATE 10 MG/10ML IV SOLN
INTRAVENOUS | Status: DC | PRN
  Administered 2014-11-23: 3 mg via INTRAVENOUS

## 2014-11-23 SURGICAL SUPPLY — 76 items
ALLOGRAFT LORDOTIC CC 7X11X14 (Bone Implant) ×2 IMPLANT
ALLOGRAFT TRIAD LORDOTIC CC (Bone Implant) ×2 IMPLANT
APL SKNCLS STERI-STRIP NONHPOA (GAUZE/BANDAGES/DRESSINGS)
BENZOIN TINCTURE PRP APPL 2/3 (GAUZE/BANDAGES/DRESSINGS) IMPLANT
BIT DRILL NEURO 2X3.1 SFT TUCH (MISCELLANEOUS) ×1 IMPLANT
BIT DRILL POWER (BIT) IMPLANT
BNDG GAUZE ELAST 4 BULKY (GAUZE/BANDAGES/DRESSINGS) ×2 IMPLANT
BUR BARREL STRAIGHT FLUTE 4.0 (BURR) ×2 IMPLANT
CANISTER SUCT 3000ML (MISCELLANEOUS) ×2 IMPLANT
CONT SPEC 4OZ CLIKSEAL STRL BL (MISCELLANEOUS) ×3 IMPLANT
COVER MAYO STAND STRL (DRAPES) ×2 IMPLANT
DECANTER SPIKE VIAL GLASS SM (MISCELLANEOUS) ×2 IMPLANT
DRAPE LAPAROTOMY 100X72 PEDS (DRAPES) ×2 IMPLANT
DRAPE MICROSCOPE LEICA (MISCELLANEOUS) ×2 IMPLANT
DRAPE POUCH INSTRU U-SHP 10X18 (DRAPES) ×2 IMPLANT
DRAPE PROXIMA HALF (DRAPES) ×1 IMPLANT
DRILL BIT POWER (BIT) ×2
DRILL NEURO 2X3.1 SOFT TOUCH (MISCELLANEOUS) ×2
DRSG TELFA 3X8 NADH (GAUZE/BANDAGES/DRESSINGS) IMPLANT
DURAPREP 6ML APPLICATOR 50/CS (WOUND CARE) ×2 IMPLANT
ELECT COATED BLADE 2.86 ST (ELECTRODE) ×2 IMPLANT
ELECT REM PT RETURN 9FT ADLT (ELECTROSURGICAL) ×2
ELECTRODE REM PT RTRN 9FT ADLT (ELECTROSURGICAL) ×1 IMPLANT
GAUZE SPONGE 4X4 12PLY STRL (GAUZE/BANDAGES/DRESSINGS) IMPLANT
GAUZE SPONGE 4X4 16PLY XRAY LF (GAUZE/BANDAGES/DRESSINGS) IMPLANT
GLOVE BIO SURGEON STRL SZ 6.5 (GLOVE) ×1 IMPLANT
GLOVE BIO SURGEON STRL SZ8 (GLOVE) ×3 IMPLANT
GLOVE BIOGEL PI IND STRL 6.5 (GLOVE) IMPLANT
GLOVE BIOGEL PI IND STRL 7.5 (GLOVE) IMPLANT
GLOVE BIOGEL PI IND STRL 8 (GLOVE) ×1 IMPLANT
GLOVE BIOGEL PI IND STRL 8.5 (GLOVE) ×1 IMPLANT
GLOVE BIOGEL PI INDICATOR 6.5 (GLOVE) ×1
GLOVE BIOGEL PI INDICATOR 7.5 (GLOVE) ×1
GLOVE BIOGEL PI INDICATOR 8 (GLOVE) ×2
GLOVE BIOGEL PI INDICATOR 8.5 (GLOVE) ×1
GLOVE ECLIPSE 8.0 STRL XLNG CF (GLOVE) ×2 IMPLANT
GLOVE EXAM NITRILE LRG STRL (GLOVE) IMPLANT
GLOVE EXAM NITRILE MD LF STRL (GLOVE) IMPLANT
GLOVE EXAM NITRILE XL STR (GLOVE) IMPLANT
GLOVE EXAM NITRILE XS STR PU (GLOVE) IMPLANT
GLOVE SURG SS PI 7.0 STRL IVOR (GLOVE) ×2 IMPLANT
GOWN STRL REUS W/ TWL LRG LVL3 (GOWN DISPOSABLE) IMPLANT
GOWN STRL REUS W/ TWL XL LVL3 (GOWN DISPOSABLE) ×1 IMPLANT
GOWN STRL REUS W/TWL 2XL LVL3 (GOWN DISPOSABLE) ×2 IMPLANT
GOWN STRL REUS W/TWL LRG LVL3 (GOWN DISPOSABLE) ×6
GOWN STRL REUS W/TWL XL LVL3 (GOWN DISPOSABLE) ×2
HALTER HD/CHIN CERV TRACTION D (MISCELLANEOUS) ×2 IMPLANT
HEMOSTAT POWDER SURGIFOAM 1G (HEMOSTASIS) ×1 IMPLANT
KIT BASIN OR (CUSTOM PROCEDURE TRAY) ×2 IMPLANT
KIT ROOM TURNOVER OR (KITS) ×2 IMPLANT
LIQUID BAND (GAUZE/BANDAGES/DRESSINGS) ×2 IMPLANT
NDL HYPO 25X1 1.5 SAFETY (NEEDLE) ×1 IMPLANT
NDL SPNL 18GX3.5 QUINCKE PK (NEEDLE) IMPLANT
NEEDLE HYPO 25X1 1.5 SAFETY (NEEDLE) ×2 IMPLANT
NEEDLE SPNL 18GX3.5 QUINCKE PK (NEEDLE) ×2 IMPLANT
NEEDLE SPNL 22GX3.5 QUINCKE BK (NEEDLE) ×2 IMPLANT
NS IRRIG 1000ML POUR BTL (IV SOLUTION) ×2 IMPLANT
PACK LAMINECTOMY NEURO (CUSTOM PROCEDURE TRAY) ×2 IMPLANT
PAD ARMBOARD 7.5X6 YLW CONV (MISCELLANEOUS) ×6 IMPLANT
PAD DRESSING TELFA 3X8 NADH (GAUZE/BANDAGES/DRESSINGS) IMPLANT
PIN DISTRACTION 14MM (PIN) ×4 IMPLANT
PLATE ARCHON 3-LEVEL 54MM (Plate) ×1 IMPLANT
RUBBERBAND STERILE (MISCELLANEOUS) ×4 IMPLANT
SCREW ARCHON SELFTAP 4.0X13 (Screw) ×8 IMPLANT
SPONGE INTESTINAL PEANUT (DISPOSABLE) ×3 IMPLANT
SPONGE SURGIFOAM ABS GEL 100 (HEMOSTASIS) ×2 IMPLANT
STAPLER SKIN PROX WIDE 3.9 (STAPLE) ×1 IMPLANT
STRIP CLOSURE SKIN 1/2X4 (GAUZE/BANDAGES/DRESSINGS) IMPLANT
SUT VIC AB 3-0 SH 8-18 (SUTURE) ×3 IMPLANT
SYR 20ML ECCENTRIC (SYRINGE) ×2 IMPLANT
SYR 5ML LL (SYRINGE) IMPLANT
TOWEL OR 17X24 6PK STRL BLUE (TOWEL DISPOSABLE) ×2 IMPLANT
TOWEL OR 17X26 10 PK STRL BLUE (TOWEL DISPOSABLE) ×2 IMPLANT
TRAP SPECIMEN MUCOUS 40CC (MISCELLANEOUS) IMPLANT
TRAY FOLEY CATH 14FRSI W/METER (CATHETERS) IMPLANT
WATER STERILE IRR 1000ML POUR (IV SOLUTION) ×2 IMPLANT

## 2014-11-23 NOTE — Discharge Instructions (Signed)

## 2014-11-23 NOTE — Op Note (Signed)
11/23/2014  3:14 PM  PATIENT:  Amanda [name]  59 y.o. female  PRE-OPERATIVE DIAGNOSIS:  Cervical spondylosis with radiculopathy, Cervical stenosis, Cervicalgia, cervical spondylosis without myelopathy C 34, C 45, C 56 levels  POST-OPERATIVE DIAGNOSIS:   Cervical spondylosis with radiculopathy, Cervical stenosis, Cervicalgia, cervical spondylosis without myelopathy C 34, C 45, C 56 levels   PROCEDURE:  Procedure(s) with comments: Cervical three-four, Cervical four-five, Cervical five-six Anterior cervical decompression/diskectomy/fusion (N/A) - C3-4 C4-5 C5-6 Anterior cervical decompression/diskectomy/fusion with autograft/allograft, plate  SURGEON:  Surgeon(s) and Role:    * Erline Levine, MD - Primary    * Floyce Stakes, MD - Assisting  PHYSICIAN ASSISTANT:   ASSISTANTS: Poteat, RN   ANESTHESIA:   general  EBL:  Total I/O In: 2050 [I.V.:1800; IV Piggyback:250] Out: -   BLOOD ADMINISTERED:none  DRAINS: none   LOCAL MEDICATIONS USED:  LIDOCAINE   SPECIMEN:  No Specimen  DISPOSITION OF SPECIMEN:  N/A  COUNTS:  YES  TOURNIQUET:  * No tourniquets in log *  DICTATION: Patient was brought to operating room and following the smooth and uncomplicated induction of general endotracheal anesthesia her head was placed on a horseshoe head holder he was placed in 5 pounds of Holter traction and her anterior neck was prepped and draped in usual sterile fashion. An incision was made on the left side of midline after infiltrating the skin and subcutaneous tissues with local lidocaine. The platysmal layer was incised and subplatysmal dissection was performed exposing the anterior border sternocleidomastoid muscle. Using blunt dissection the carotid sheath was kept lateral and trachea and esophagus kept medial exposing the anterior cervical spine. A bent spinal needle was placed it was felt to be the C34 and C 45  levels and this was confirmed on intraoperative x-ray. Longus coli muscles  were taken down from the anterior cervical spine using electrocautery and key elevator and self-retaining retractor was placed. The interspace at C 34, C 45, C 56 were incised and a thorough discectomy was performed. Distraction pins were placed. Uncinate spurs and central spondylitic ridges were drilled down with a high-speed drill. The spinal cord dura and both C 4 nerve roots were widely decompressed. Hemostasis was assured. After trial sizing a 6 mm allograft bone wedge was selected and packed with local autograft. The graft was tamped into position and countersunk appropriately. The retractor was moved and the interspace at C 45 was incised and a thorough discectomy was performed. Distraction pins were placed. Uncinate spurs and central spondylitic ridges were drilled down with a high-speed drill. The spinal cord dura and both C 5 nerve roots were widely decompressed. Hemostasis was assured. After trial sizing a 7 mm allograft bone wedge was selected and packed with local autograft. The graft was tamped into position and countersunk appropriately.The interspace at C 56 was incised and a thorough discectomy was performed. Distraction pins were placed. Uncinate spurs and central spondylitic ridges were drilled down with a high-speed drill. The spinal cord dura and both C5 nerve roots were widely decompressed.  Hemostasis was assured. After trial sizing a 7 mm allograft bone wedge was selected and packed with local autograft The graft was tamped into position and countersunk appropriately.  Distraction weight was removed. A 54 mm Nuvasive Archon anterior cervical plate was affixed to the cervical spine with 13 mm variable-angle screws 2 at C 3, 2 at C 4, 2 at C 5,  and 2 at C 6. All screws were well-positioned and locking mechanisms were engaged. Soft  tissues were inspected and found to be in good repair. The wound was irrigated. A final x-ray was obtained with good visualization from C 3 through C 5 levels with  the interbody grafts well visualized. The platysma layer was closed with 3-0 Vicryl stitches and the skin was reapproximated with 3-0 Vicryl subcuticular stitches. The wound was dressed with Dermabond. Counts were correct at the end of the case. Patient was extubated and taken to recovery in stable and satisfactory condition.     PLAN OF CARE: Admit to inpatient   PATIENT DISPOSITION:  PACU - hemodynamically stable.   Delay start of Pharmacological VTE agent (>24hrs) due to surgical blood loss or risk of bleeding: yes

## 2014-11-23 NOTE — Transfer of Care (Signed)
Immediate Anesthesia Transfer of Care Note  Patient: Amanda [name]  Procedure(s) Performed: Procedure(s) with comments: Cervical three-four, Cervical four-five, Cervical five-six Anterior cervical decompression/diskectomy/fusion (N/A) - C3-4 C4-5 C5-6 Anterior cervical decompression/diskectomy/fusion  Patient Location: PACU  Anesthesia Type:General  Level of Consciousness: awake, alert , oriented and patient cooperative  Airway & Oxygen Therapy: Patient Spontanous Breathing and Patient connected to nasal cannula oxygen  Post-op Assessment: Report given to PACU RN, Post -op Vital signs reviewed and stable and Patient moving all extremities  Post vital signs: Reviewed and stable  Complications: No apparent anesthesia complications

## 2014-11-23 NOTE — Anesthesia Preprocedure Evaluation (Signed)
Anesthesia Evaluation  Patient identified by MRN, date of birth, ID band Patient awake    Reviewed: Allergy & Precautions, NPO status , Patient's Chart, lab work & pertinent test results  Airway Mallampati: II  TM Distance: >3 FB Neck ROM: Full    Dental  (+) Teeth Intact   Pulmonary sleep apnea ,  breath sounds clear to auscultation        Cardiovascular negative cardio ROS  Rhythm:Regular     Neuro/Psych  Headaches, negative psych ROS   GI/Hepatic negative GI ROS, Neg liver ROS,   Endo/Other  negative endocrine ROS  Renal/GU negative Renal ROS     Musculoskeletal  (+) Arthritis -,   Abdominal   Peds  Hematology  (+) Sickle cell trait and anemia ,   Anesthesia Other Findings   Reproductive/Obstetrics                             Anesthesia Physical Anesthesia Plan  ASA: II  Anesthesia Plan: General   Post-op Pain Management:    Induction: Intravenous  Airway Management Planned: Oral ETT  Additional Equipment: None  Intra-op Plan:   Post-operative Plan: Extubation in OR  Informed Consent: I have reviewed the patients History and Physical, chart, labs and discussed the procedure including the risks, benefits and alternatives for the proposed anesthesia with the patient or authorized representative who has indicated his/her understanding and acceptance.   Dental advisory given  Plan Discussed with: CRNA and Surgeon  Anesthesia Plan Comments:         Anesthesia Quick Evaluation

## 2014-11-23 NOTE — Progress Notes (Signed)
Orthopedic Tech Progress Note Patient Details:  Amanda [name] Mar 21, 1956 [id number]  Ortho Devices Type of Ortho Device: Soft collar Ortho Device/Splint Location: neck Ortho Device/Splint Interventions: Ordered, Application   Braulio Bosch 11/23/2014, 3:45 PM

## 2014-11-23 NOTE — Progress Notes (Signed)
Hemoglobin noted to be 8.1 from PAT labs. Dr. Ermalene Postin called and informed with orders for repeat H/H and T&S which were done. Pt reports hx of anemia but denies blood transfusion.

## 2014-11-23 NOTE — Progress Notes (Signed)
Awake, alert, conversant, with full strength both Deltoids, Biceps, Triceps.  MAEW.  Doing well.

## 2014-11-23 NOTE — Plan of Care (Signed)
Problem: Consults Goal: Diagnosis - Spinal Surgery Outcome: Completed/Met Date Met:  11/23/14 Cervical Spine Fusion

## 2014-11-23 NOTE — Interval H&P Note (Signed)
History and Physical Interval Note:  11/23/2014 11:00 AM  Amanda [name]  has presented today for surgery, with the diagnosis of Cervical spondylosis with radiculopathy, Cervical stenosis, Cervicalgia  The various methods of treatment have been discussed with the patient and family. After consideration of risks, benefits and other options for treatment, the patient has consented to  Procedure(s) with comments: C3-4 C4-5 C5-6 Anterior cervical decompression/diskectomy/fusion (N/A) - C3-4 C4-5 C5-6 Anterior cervical decompression/diskectomy/fusion as a surgical intervention .  The patient's history has been reviewed, patient examined, no change in status, stable for surgery.  I have reviewed the patient's chart and labs.  Questions were answered to the patient's satisfaction.     [name]

## 2014-11-23 NOTE — Anesthesia Postprocedure Evaluation (Signed)
  Anesthesia Post-op Note  Patient: Amanda [name]  Procedure(s) Performed: Procedure(s) with comments: Cervical three-four, Cervical four-five, Cervical five-six Anterior cervical decompression/diskectomy/fusion (N/A) - C3-4 C4-5 C5-6 Anterior cervical decompression/diskectomy/fusion  Patient Location: PACU  Anesthesia Type:General  Level of Consciousness: awake  Airway and Oxygen Therapy: Patient Spontanous Breathing  Post-op Pain: mild  Post-op Assessment: Post-op Vital signs reviewed, Patient's Cardiovascular Status Stable, Respiratory Function Stable, Patent Airway, No signs of Nausea or vomiting and Pain level controlled  Post-op Vital Signs: Reviewed  Last Vitals:  Filed Vitals:   11/23/14 1706  BP: 145/88  Pulse: 85  Temp: 36.5 C  Resp: 18    Complications: No apparent anesthesia complications

## 2014-11-23 NOTE — Brief Op Note (Signed)
11/23/2014  3:14 PM  PATIENT:  Amanda [name]  59 y.o. female  PRE-OPERATIVE DIAGNOSIS:  Cervical spondylosis with radiculopathy, Cervical stenosis, Cervicalgia, cervical spondylosis without myelopathy C 34, C 45, C 56 levels  POST-OPERATIVE DIAGNOSIS:   Cervical spondylosis with radiculopathy, Cervical stenosis, Cervicalgia, cervical spondylosis without myelopathy C 34, C 45, C 56 levels   PROCEDURE:  Procedure(s) with comments: Cervical three-four, Cervical four-five, Cervical five-six Anterior cervical decompression/diskectomy/fusion (N/A) - C3-4 C4-5 C5-6 Anterior cervical decompression/diskectomy/fusion with autograft/allograft, plate  SURGEON:  Surgeon(s) and Role:    * Erline Levine, MD - Primary    * Floyce Stakes, MD - Assisting  PHYSICIAN ASSISTANT:   ASSISTANTS: Poteat, RN   ANESTHESIA:   general  EBL:  Total I/O In: 2050 [I.V.:1800; IV Piggyback:250] Out: -   BLOOD ADMINISTERED:none  DRAINS: none   LOCAL MEDICATIONS USED:  LIDOCAINE   SPECIMEN:  No Specimen  DISPOSITION OF SPECIMEN:  N/A  COUNTS:  YES  TOURNIQUET:  * No tourniquets in log *  DICTATION: Patient was brought to operating room and following the smooth and uncomplicated induction of general endotracheal anesthesia her head was placed on a horseshoe head holder he was placed in 5 pounds of Holter traction and her anterior neck was prepped and draped in usual sterile fashion. An incision was made on the left side of midline after infiltrating the skin and subcutaneous tissues with local lidocaine. The platysmal layer was incised and subplatysmal dissection was performed exposing the anterior border sternocleidomastoid muscle. Using blunt dissection the carotid sheath was kept lateral and trachea and esophagus kept medial exposing the anterior cervical spine. A bent spinal needle was placed it was felt to be the C34 and C 45  levels and this was confirmed on intraoperative x-ray. Longus coli muscles  were taken down from the anterior cervical spine using electrocautery and key elevator and self-retaining retractor was placed. The interspace at C 34, C 45, C 56 were incised and a thorough discectomy was performed. Distraction pins were placed. Uncinate spurs and central spondylitic ridges were drilled down with a high-speed drill. The spinal cord dura and both C 4 nerve roots were widely decompressed. Hemostasis was assured. After trial sizing a 6 mm allograft bone wedge was selected and packed with local autograft. The graft was tamped into position and countersunk appropriately. The retractor was moved and the interspace at C 45 was incised and a thorough discectomy was performed. Distraction pins were placed. Uncinate spurs and central spondylitic ridges were drilled down with a high-speed drill. The spinal cord dura and both C 5 nerve roots were widely decompressed. Hemostasis was assured. After trial sizing a 7 mm allograft bone wedge was selected and packed with local autograft. The graft was tamped into position and countersunk appropriately.The interspace at C 56 was incised and a thorough discectomy was performed. Distraction pins were placed. Uncinate spurs and central spondylitic ridges were drilled down with a high-speed drill. The spinal cord dura and both C5 nerve roots were widely decompressed.  Hemostasis was assured. After trial sizing a 7 mm allograft bone wedge was selected and packed with local autograft The graft was tamped into position and countersunk appropriately.  Distraction weight was removed. A 54 mm Nuvasive Archon anterior cervical plate was affixed to the cervical spine with 13 mm variable-angle screws 2 at C 3, 2 at C 4, 2 at C 5,  and 2 at C 6. All screws were well-positioned and locking mechanisms were engaged. Soft  tissues were inspected and found to be in good repair. The wound was irrigated. A final x-ray was obtained with good visualization from C 3 through C 5 levels with  the interbody grafts well visualized. The platysma layer was closed with 3-0 Vicryl stitches and the skin was reapproximated with 3-0 Vicryl subcuticular stitches. The wound was dressed with Dermabond. Counts were correct at the end of the case. Patient was extubated and taken to recovery in stable and satisfactory condition.     PLAN OF CARE: Admit to inpatient   PATIENT DISPOSITION:  PACU - hemodynamically stable.   Delay start of Pharmacological VTE agent (>24hrs) due to surgical blood loss or risk of bleeding: yes

## 2014-11-23 NOTE — H&P (Signed)
[place], [place] [address] Phone: [phone]   Patient ID:   [id number] Patient: Amanda [name]  Date of Birth: [birth year] Visit Type: Office Visit   Date: 09/24/2014 01:45 PM Provider: Marchia Meiers. Vertell Limber MD   This 59 year old female presents for Follow Up of back pain and Follow Up of neck pain.  History of Present Illness: 1.  Follow Up of back pain  2.  Follow Up of neck pain  The patient returns today to review her cervical MRI and her current pain complaints.  She has significant disc herniations at the C3 C4, C4 C5, C5 C6 levels.  She is currently complaining of right shoulder and arm pain and she grades this as 15 out of 10.  She initially was complaining of pain on both sides but now says it is almost entirely on the right side.  She says her right shoulder is better and is now 3-4 out of 10 in the severity of her pain and says before surgery it was 25 out of 10.  She is also complaining of low back pain but my understanding is that the that is not covered by [employer].  We therefore did not address her low back pain today in the office.  Repeat physical examination demonstrates a positive Spurling maneuver to the right with right deltoid weakness at 4-5 and right biceps weakness at 4+ out of 5.  In light of her protracted pain complaints and persistent significant discomfort I have recommended proceeding with surgery and this would consist of anterior cervical decompression and fusion at C3 C4, C4 C5, C5 C6 levels.  The patient will need approval by [employer] and we'll fit her for a soft cervical collar at that point.      Medical/Surgical/Interim History Reviewed, no change.  Last detailed document date:08/13/2014.   PAST MEDICAL HISTORY, SURGICAL HISTORY, FAMILY HISTORY, SOCIAL HISTORY AND REVIEW OF SYSTEMS I have reviewed the patient's past medical, surgical, family and social history as well as the comprehensive  review of systems as included on the [facility] history form dated 08/13/2014, which I have signed.  Family History: Reviewed, no changes.  Last detailed document: 08/13/2014.   Social History: Tobacco use reviewed. Reviewed, no changes. Last detailed document date: 08/13/2014.      MEDICATIONS(added, continued or stopped this visit):   Started Medication Directions Instruction Stopped   ACETAMINOPHEN WITH CODEINE take 1 - 2 tablets by oral route  every 8 hours as needed    09/17/2014 cyclobenzaprine 10 mg tablet take 1 tablet by oral route up to TID prn spasm    09/17/2014 naproxen 500 mg tablet take 1 tablet by oral route 2 times every day with food     simvastatin 20 mg tablet take 1 tablet by oral route  every day in the evening      ALLERGIES:  Ingredient Reaction Medication Name Comment  NO KNOWN ALLERGIES     No known allergies.    Vitals Date Temp F BP Pulse Ht In Wt Lb BMI BSA Pain Score  09/24/2014  134/74 84 61 154 29.1  6/10        IMPRESSION Persistent cervical radiculopathy despite conservative care.  Her shoulder complaints have improved significantly.  Her arm complaints of worsened.  She has cervical disc pathology at C3 C4, C4 C5, C5 C6 levels and I recommended surgery at each of these levels.  This consisted of anterior cervical  decompression and fusion of C3 C4, C4 C5, C5 C6 levels.  Completed Orders (this encounter) Order Details Reason Side Interpretation Result Initial Treatment Date Region  Lifestyle education regarding diet Encouraged to eat a well balanced diet and follow up with primary care physician.         Assessment/Plan # Detail Type Description   1. Assessment Cervical spondylosis with radiculopathy (M47.22).       2. Assessment Radiculopathy, cervical region (M54.12).       3. Assessment Rotator cuff arthropathy, right (M12.811).       4. Assessment Spinal stenosis, cervical region (M48.02).        5. Assessment Cervicalgia (M54.2).       6. Assessment Body mass index (BMI) 29.0-29.9, adult (C37.62).   Plan Orders Today's instructions / counseling include(s) Lifestyle education regarding diet.         Pain Assessment/Treatment Pain Scale: 6/10. Method: Numeric Pain Intensity Scale. Location: back/neck. Onset: 07/18/2013. Duration: varies. Quality: discomforting. Pain Assessment/Treatment follow-up plan of care: Patient is taking medications as prescribed..  Risks and benefits of surgery were joined in detail with the patient and she wishes to proceed.  She'll need to be fitted for a soft cervical collar before surgery and this will be to be approved by [employer] adjustor.  Orders: Diagnostic Procedures: Assessment Procedure  M47.22 ACDF - C3-C4 - C4-C5 - C5-C6  Instruction(s)/Education: Assessment Instruction  [phone] Lifestyle education regarding diet             Provider:  Marchia Meiers. Vertell Limber MD  09/30/2014 01:58 PM Dictation edited by: Marchia Meiers. Vertell Limber    CC Providers: [place] [facility] [place], [place] [address]- ----------------------------------------------------------------------------------------------------------------------------------------------------------------------         Electronically signed by Marchia Meiers. Vertell Limber MD on 09/30/2014 01:58 PM  > [place], [place] [address] Phone: [phone]   Patient ID:   [id number] Patient: Amanda [name]  Date of Birth: 1956-01-08 Visit Type: Office Visit   Date: 08/13/2014 09:15 AM Provider: Marchia Meiers. Vertell Limber MD   This 59 year old female presents for back pain and neck pain.  History of Present Illness: 1.  back pain  2.  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  Lumbar pain has  increased recently as well.  She recalls injuring her right shoulder while lifting a pt Sept 2014, returning to full duty & reinjuring same in Jan 2015.  MRI's obtained & Rt rotaoto cuff repair performed by DrBlackmon.   She is frustrated that she can no longer ride bike, swim, or exercise without pain.  PT no help after 30+ visits  Hx: cholesterol SxHx: 3/15 RtRotaotor cuff; '05 Bilat CTR; '03 hysterectomy; '05 colon resection; 12/05 hernia; '89 tubal ligation; '00 foot  imaging 1/15 on [facility]  Patient complains of right-sided neck and shoulder pain.  She also notes low back pain.  She describes neck pain which is intermittent and lumbar pain which is constant.  The patient underwent 30 sessions of physical therapy after rotator cuff surgery.  She describes right-sided arm pain.  She has not had any recent imaging of her cervical spine, as she says the pain is much worse than it was previously.        PAST MEDICAL/SURGICAL HISTORY   (Detailed)  Disease/disorder Onset Date Management Date Comments  Carpal tunnel release 2005     Right rotator cuff repair 01/2014     Hysterectomy, total 2003     Colon resection 2005     Hernia repair 2005     Tubal Ligation 1989     Foot surgery 2000   High cholesterol          PAST MEDICAL HISTORY, SURGICAL HISTORY, FAMILY HISTORY, SOCIAL HISTORY AND REVIEW OF SYSTEMS I have reviewed the patient's past medical, surgical, family and social history as well as the comprehensive review of systems as included on the [facility] history form dated 08/13/2014, which I have signed.  Family History  (Detailed)  Relationship Family Member Name Deceased Age at Death Condition Onset Age Cause of Death      Family history of Hypertension  N      Family history of Diabetes mellitus  N   SOCIAL HISTORY  (Detailed) Tobacco use reviewed. Preferred language is Unknown.   Smoking status: Never smoker.  SMOKING STATUS Use  Status Type Smoking Status Usage Per Day Years Used Total Pack Years  no/never  Never smoker             MEDICATIONS(added, continued or stopped this visit):   Started Medication Directions Instruction Stopped   acetaminophen-codeine take 1 - 2 tablets by oral route  every 8 hours as needed     Flexeril 10 mg ORAL TABLET 2-3 tablets daily     naproxen 500 mg tablet take 1 tablet by oral route 2 times every day with food     simvastatin 20 mg tablet take 1 tablet by oral route  every day in the evening      ALLERGIES:  Ingredient Reaction Medication Name Comment  NO KNOWN ALLERGIES     No known allergies.   REVIEW OF SYSTEMS System Neg/Pos Details  Constitutional Negative Chills, fatigue, fever, malaise, night sweats, weight gain and weight loss.  ENMT Negative Ear drainage, hearing loss, nasal drainage, otalgia, sinus pressure and sore throat.  Eyes Negative Eye discharge, eye pain and vision changes.  Respiratory Negative Chronic cough, cough, dyspnea, known TB exposure and wheezing.  Cardio Negative Chest pain, claudication, edema and irregular heartbeat/palpitations.  GI Negative Abdominal pain, blood in stool, change in stool pattern, constipation, decreased appetite, diarrhea, heartburn, nausea and vomiting.  GU Negative Dysuria, hematuria, hot flashes, irregular menses, polyuria, urinary frequency, urinary incontinence and urinary retention.  Endocrine Negative Cold intolerance, heat intolerance, polydipsia and polyphagia.  Neuro Negative Dizziness, extremity weakness, gait disturbance, headache, memory impairment, numbness in extremity, seizures and tremors.  Psych Negative Anxiety, depression and insomnia.  Integumentary Negative Brittle hair, brittle nails, change in shape/size of mole(s), hair loss, hirsutism, hives, pruritus, rash and skin lesion.  MS Positive Back pain, Neck pain.  Hema/Lymph Negative Easy bleeding, easy bruising and lymphadenopathy.   Allergic/Immuno Negative Contact allergy, environmental allergies, food allergies and seasonal allergies.  Reproductive Negative Breast discharge, breast lump(s), dysmenorrhea, dyspareunia, history of abnormal PAP smear and vaginal discharge.    Vitals Date Temp F BP Pulse Ht In Wt Lb BMI BSA Pain Score  08/13/2014  140/78 106 61 151 28.53  8/10     PHYSICAL EXAM General Level of Distress: no acute distress Overall Appearance: normal    Cardiovascular Cardiac: regular rate and rhythm without murmur  Right Left  Carotid Pulses: normal normal  Respiratory Lungs: clear to auscultation  Neurological Orientation: normal Recent and Remote Memory: normal Attention Span and Concentration:  normal Language: normal Fund of Knowledge: normal  Right Left Sensation: normal normal Upper Extremity Coordination: normal normal  Lower Extremity Coordination: normal normal  Musculoskeletal Gait and Station: normal  Right Left Upper Extremity Muscle Strength: normal normal Lower Extremity Muscle Strength: normal normal Upper Extremity Muscle Tone:  normal normal Lower Extremity Muscle Tone: normal normal  Motor Strength Upper and lower extremity motor strength was tested in the clinically pertinent muscles. Any abnormal findings will be noted below.   Right Left Deltoid: 4/5    Deep Tendon Reflexes  Right Left Biceps: normal normal Triceps: normal normal Brachiloradialis: normal normal Patellar: normal normal Achilles: normal normal  Sensory Sensation was tested at C2 to T1. Any abnormal findings will be noted below.  Right Left C5: hyperpathic    Cranial Nerves II. Optic Nerve/Visual Fields: normal III. Oculomotor: normal IV. Trochlear: normal V. Trigeminal: normal VI. Abducens: normal VII. Facial: normal VIII. Acoustic/Vestibular: normal IX. Glossopharyngeal: normal X. Vagus: normal XI. Spinal Accessory: normal XII. Hypoglossal: normal  Motor and other  Tests Lhermittes: negative Rhomberg: negative Pronator drift: absent     Right Left Spurlings positive negative Hoffman's: normal normal Clonus: normal normal Babinski: normal normal SLR: negative negative Patrick's Corky Sox): negative negative Toe Walk: normal normal Toe Lift: normal normal Heel Walk: normal normal Tinels Elbow: negative negative Tinels Wrist: negative negative Phalen: negative negative      DIAGNOSTIC RESULTS Diagnostic report text  CLINICAL DATA: Lifting injury. Neck pain radiating into the right shoulder with right arm weakness.  EXAM: MRI CERVICAL SPINE WITHOUT CONTRAST  TECHNIQUE: Multiplanar, multisequence MR imaging was performed. No intravenous contrast was administered.  COMPARISON: None.  FINDINGS: Images are moderately degraded by motion artifact.  Hemangioma is noted in the C3 vertebral body. Vertebral marrow signal is otherwise unremarkable. There is trace retrolisthesis of C3 on C4, C4 on C5, and C5 on C6. The craniocervical junction is unremarkable. Cervical spinal cord is normal in signal. Paraspinal soft tissues are unremarkable.  C2-3: Small central disc protrusion may slightly flatten the ventral spinal cord. There is noted spinal canal or neural foraminal stenosis.  C3-4: Mild disc bulge and uncovertebral hypertrophy results in borderline spinal canal stenosis and mild-to-moderate right neural foraminal stenosis.  C4-5: Central disc protrusion and uncovertebral hypertrophy result in moderate spinal canal stenosis and mild bilateral neural foraminal stenosis.  C5-6: Shallow broad-based disc osteophyte complex and left greater than right uncovertebral hypertrophy result in mild spinal canal stenosis and moderate left neural foraminal stenosis.  C6-7: Negative.  C7-T1: Negative.  IMPRESSION: Moderate motion artifact. Multilevel degenerative disc disease, greatest at C4-5 where a disc protrusion results in moderate  spinal stenosis. There is mild to moderate multilevel neural foraminal stenosis as above.   Electronically Signed By: Logan Bores On: 11/25/2013 08:58    IMPRESSION Patient appears to have right shoulder pain as well as right deltoid weakness.  She has had previous right shoulder surgery and also has multilevel cervical spondylosis on MRI of her cervical spine from January 2015.  Completed Orders (this encounter) Order Details Reason Side Interpretation Result Initial Treatment Date Region  Lifestyle education regarding diet Encouraged to eat a well balanced diet and follow up with primary care physician.         Assessment/Plan # Detail Type Description   1. Assessment Radiculopathy, cervical region (M54.12).       2. Assessment Cervical spondylosis with radiculopathy (M47.22).       3. Assessment Rotator cuff arthropathy, right (M12.811).  4. Assessment Body mass index (BMI) 28.0-28.9, adult (R32.02).   Plan Orders Today's instructions / counseling include(s) Lifestyle education regarding diet.         Pain Assessment/Treatment Pain Scale: 8/10. Method: Numeric Pain Intensity Scale. Location: back/neck. Onset: 07/18/2013. Duration: varies. Quality: discomforting. Pain Assessment/Treatment follow-up plan of care: Patient is currently taking medication for pain as prescribed..  I recommended the patient have a repeat MRI of her cervical spine and then follow up with me to evaluate that.  Orders: Diagnostic Procedures: Assessment Procedure  M54.12 MRI Spinal/cerv W/o Contrast  M54.12 Return to Clinic after study is performed  Instruction(s)/Education: Assessment Instruction  Z68.28 Lifestyle education regarding diet             Provider:  Marchia Meiers. Vertell Limber MD  08/26/2014 05:24 PM Dictation edited by: Marchia Meiers. Vertell Limber    CC Providers: [place] [facility] [place], [place]  [id number]- ----------------------------------------------------------------------------------------------------------------------------------------------------------------------         Electronically signed by Marchia Meiers. Vertell Limber MD on 08/26/2014 05:24 PM

## 2014-11-23 NOTE — Anesthesia Procedure Notes (Signed)
Procedure Name: Intubation Date/Time: 11/23/2014 12:12 PM Performed by: Julian Reil Pre-anesthesia Checklist: Patient identified, Suction available, Patient being monitored and Emergency Drugs available Patient Re-evaluated:Patient Re-evaluated prior to inductionOxygen Delivery Method: Circle system utilized Preoxygenation: Pre-oxygenation with 100% oxygen Intubation Type: IV induction Ventilation: Mask ventilation without difficulty Laryngoscope Size: Mac and 4 Grade View: Grade I Tube type: Oral Tube size: 7.0 mm Number of attempts: 1 Airway Equipment and Method: Stylet Placement Confirmation: ETT inserted through vocal cords under direct vision,  positive ETCO2 and breath sounds checked- equal and bilateral Secured at: 21 cm Tube secured with: Tape Dental Injury: Teeth and Oropharynx as per pre-operative assessment

## 2014-11-24 DIAGNOSIS — M4802 Spinal stenosis, cervical region: Secondary | ICD-10-CM | POA: Diagnosis not present

## 2014-11-24 MED ORDER — PANTOPRAZOLE SODIUM 40 MG PO TBEC: 40.0000 mg | DELAYED_RELEASE_TABLET | Freq: Every day | ORAL | Status: DC

## 2014-11-24 MED ORDER — OXYCODONE-ACETAMINOPHEN 5-325 MG PO TABS: 1.0000 | ORAL_TABLET | ORAL | Status: DC | PRN

## 2014-11-24 MED ORDER — CYCLOBENZAPRINE HCL 10 MG PO TABS: 10.0000 mg | ORAL_TABLET | Freq: Three times a day (TID) | ORAL | Status: DC | PRN

## 2014-11-24 NOTE — Evaluation (Addendum)
Occupational Therapy Evaluation Patient Details Name: Amanda [name] MRN: [id number] DOB: [birth year] Today's Date: 11/24/2014    History of Present Illness 59 y.o. s/p C3-4 C4-5 C5-6 Anterior cervical decompression/diskectomy/fusion.   Clinical Impression   Pt s/p above. Education provided in session. OT signing off.    Follow Up Recommendations  No OT follow up;Supervision - Intermittent    Equipment Recommendations  Tub/shower seat;Other (comment) (AE-reacher)    Recommendations for Other Services       Precautions / Restrictions Precautions Precautions: Cervical Precaution Comments: educated/reviewed precautions-pt already had handout Required Braces or Orthoses: Cervical Brace Cervical Brace: Soft collar (off for bathing; otherwise on at all times) Restrictions Weight Bearing Restrictions: No      Mobility Bed Mobility Overal bed mobility: Needs Assistance Bed Mobility: Rolling;Sidelying to Sit;Sit to Sidelying Rolling: Supervision Sidelying to sit: Supervision     Sit to sidelying: Supervision General bed mobility comments: cues for technique.  Transfers Overall transfer level: Modified independent               General transfer comment: verified pt's technique for toilet transfer    Balance Overall balance assessment: No apparent balance deficits (not formally assessed)                                          ADL Overall ADL's : Needs assistance/impaired                     Lower Body Dressing: Set up;Supervision/safety;Sit to/from stand   Toilet Transfer: Modified Independent;Ambulation;Comfort height toilet           Functional mobility during ADLs: Modified independent (did bump into a trash can, but was okay ) General ADL Comments: Educated on technique for LB ADLs and talked about using shoulder chair. Discussed options for shower chair. Recommended someone being with her for tub transfer. Talked about  technique for tub transfer and recommended pt not getting down in tub for bath. Discussed cervical collar. Pt able to cross legs for LB ADLs.  Discussed maintaining precautions during functional activities. Discussed use of reacher.  educated on safety (rugs, items on floor/clutter). Suggested friend moving items to counter level so pt could reach them.     Vision                     Perception     Praxis      Pertinent Vitals/Pain Pain Assessment: 0-10 Pain Score: 8  Pain Intervention(s): Monitored during session;Other (comment) (pt asked nurse for pain meds)     Hand Dominance     Extremity/Trunk Assessment Upper Extremity Assessment Upper Extremity Assessment: RUE deficits/detail RUE Deficits / Details: h/o rotator cuff injury limiting shoulder ROM   Lower Extremity Assessment Lower Extremity Assessment: Overall WFL for tasks assessed       Communication Communication Communication: No difficulties   Cognition Arousal/Alertness: Awake/alert Behavior During Therapy: WFL for tasks assessed/performed Overall Cognitive Status: No family/caregiver present to determine baseline cognitive functioning (VERY talkative)                     General Comments       Exercises       Shoulder Instructions      Home Living Family/patient expects to be discharged to:: Private residence Living Arrangements: Alone Available Help at Discharge: Friend(s) Type of Home: House  Home Access: Level entry     Home Layout: One level     Bathroom Shower/Tub: Teacher, early years/pre:  (unsure; initially stated it was regular)   Equipment: pt has hand held shower hose                Prior Functioning/Environment Level of Independence: Independent             OT Diagnosis: Acute pain   OT Problem List:     OT Treatment/Interventions:      OT Goals(Current goals can be found in the care plan section) ADL Goals Additional ADL Goal #1: Pt will  be independent with HEP for RUE.   OT Frequency:     Barriers to D/C:            Co-evaluation              End of Session Equipment Utilized During Treatment: Cervical collar Nurse Communication: Other (comment) (DME/AE recommendations)  Activity Tolerance: Patient tolerated treatment well Patient left: Other (comment) (up ambulating-nurse said it was okay)   Time: 9357-0177 OT Time Calculation (min): 20 min Charges:  OT General Charges $OT Visit: 1 Procedure OT Evaluation $Initial OT Evaluation Tier I: 1 Procedure G-Codes: OT G-codes **NOT FOR INPATIENT CLASS** Functional Assessment Tool Used: clinical judgment Functional Limitation: Self care Self Care Current Status (L3903): At least 1 percent but less than 20 percent impaired, limited or restricted Self Care Goal Status (E0923): At least 1 percent but less than 20 percent impaired, limited or restricted Self Care Discharge Status [id number]): At least 1 percent but less than 20 percent impaired, limited or restricted  Benito Mccreedy OTR/L [phone] 11/24/2014, 10:03 AM

## 2014-11-24 NOTE — Discharge Summary (Signed)
Physician Discharge Summary  Patient ID: Amanda [name] MRN: [id number] DOB/AGE: 05/20/1956 59 y.o.  Admit date: 11/23/2014 Discharge date: 11/24/2014  Admission Diagnoses:  Cervical spondylosis with radiculopathy, Cervical stenosis, Cervicalgia, cervical spondylosis without myelopathy C 34, C 45, C 56 levels  Discharge Diagnoses:  Cervical spondylosis with radiculopathy, Cervical stenosis, Cervicalgia, cervical spondylosis without myelopathy C 34, C 45, C 56 levels Active Problems:   Cervical spondylosis without myelopathy   Discharged Condition: good  Hospital Course: Patient was admitted by Dr. Vertell Limber who performed a 3 level C3-4, C4-5, and C5-6 ACDF. Postoperatively she is done well. She is up and ambulating actively in the halls. Her incision is clean and dry. She is asking to be discharged to home. She is scheduled for follow-up with Dr. Vertell Limber in about 3-4 weeks. She is to let him know if any difficulties develop in the interim. She's been given instructions regarding wound care and activities.  Discharge Exam: Blood pressure 149/71, pulse 93, temperature 98.4 F (36.9 C), temperature source Oral, resp. rate 18, weight 69.854 kg (154 lb), SpO2 100 %.  Disposition: Home     Medication List    TAKE these medications        acetaminophen 500 MG tablet  Commonly known as:  TYLENOL  Take 500-1,000 mg by mouth every 6 (six) hours as needed for mild pain or moderate pain.     ADVANCED E 400 UNITS Caps  Take 1 capsule by mouth daily.     CALTRATE 600+D 600-400 MG-UNIT per tablet  Generic drug:  Calcium Carbonate-Vitamin D  Take 1 tablet by mouth daily.     cetirizine 10 MG tablet  Commonly known as:  ZYRTEC  Take 10 mg by mouth daily as needed for allergies.     cyclobenzaprine 10 MG tablet  Commonly known as:  FLEXERIL  Take 1 tablet (10 mg total) by mouth 3 (three) times daily as needed for muscle spasms.     cyclobenzaprine 10 MG tablet  Commonly known as:  FLEXERIL   Take 10 mg by mouth 3 (three) times daily as needed for muscle spasms.     diphenhydrAMINE 25 mg capsule  Commonly known as:  BENADRYL  Take 25 mg by mouth every 6 (six) hours as needed for allergies.     folic acid 1 MG tablet  Commonly known as:  FOLVITE  Take 1 mg by mouth daily.     ibuprofen 800 MG tablet  Commonly known as:  ADVIL,MOTRIN  Take 800 mg by mouth 3 (three) times daily as needed. For headache     multivitamin with minerals tablet  Take 1 tablet by mouth daily.     naproxen 500 MG tablet  Commonly known as:  NAPROSYN  Take 500 mg by mouth 2 (two) times daily with a meal.     neomycin-polymyxin-hydrocortisone otic solution  Commonly known as:  CORTISPORIN  Place 4 drops into the right ear 4 (four) times daily.     oxyCODONE-acetaminophen 5-325 MG per tablet  Commonly known as:  PERCOCET/ROXICET  Take 1-2 tablets by mouth every 4 (four) hours as needed for moderate pain.     simvastatin 20 MG tablet  Commonly known as:  ZOCOR  Take 20 mg by mouth daily.     Vitamin B-12 CR 1000 MCG Tbcr  Take 1 tablet by mouth daily.           Follow-up Information    Follow up with Peggyann Shoals, MD.   Specialty:  Neurosurgery   Contact information:   [address] [place] [address] [phone]       Signed: Hosie Spangle, MD 11/24/2014, 7:50 AM

## 2014-11-24 NOTE — Care Management Note (Signed)
    Page 1 of 1   11/25/2014     8:54:59 AM CARE MANAGEMENT NOTE 11/25/2014  Patient:  Amanda [name], Amanda [name]   Account Number:  [id number]  Date Initiated:  11/24/2014  Documentation initiated by:  [facility]  Subjective/Objective Assessment:   adm: Cervical spondylosis with radiculopathy, Cervical stenosis, Cervicalgia, cervical spondylosis without myelopathy C 34, C 45, C 56 levels     Action/Plan:   discharge planning   Anticipated DC Date:  11/24/2014   Anticipated DC Plan:  [place]  CM consult      Choice offered to / List presented to:     DME arranged  [employer] [employer]  OTHER - SEE COMMENT           Status of service:  Completed, signed off Medicare Important Message given?   (If response is "NO", the following Medicare IM given date fields will be blank) Date Medicare IM given:   Medicare IM given by:   Date Additional Medicare IM given:   Additional Medicare IM given by:    Discharge Disposition:  HOME/SELF CARE  Per UR Regulation:    If discussed at Long Length of Stay Meetings, dates discussed:    Comments:  11/25/14 Cm received callback from [facility] with fax number to fax orders, facesheet and OT EVAL.  Lorrine states she will have DME delivered to home of pt on Monday 11/26/14. No other Cm needs were communicated.  Mariane Masters, BSN, [facility]ear[facility] Les[facility] [phone]. 11/24/14 09:00 CM met with pt in room to get a contact number for her WC.  Pt states the only number she has is the one on the facesheet and her contact name is Lorine. CM called Lorine while in pt's room and left voicemail. (WC not open on weekend).   CM will need to fax the facesheet, orders, DC summary and OT EVAL to Lorine's fax when CM gets a callback with the number.  Will pass to weekday CM. Mariane Masters, BSN, CM [phone].

## 2014-11-24 NOTE — Progress Notes (Signed)
Patient alert and oriented, mae's well, voiding adequate amount of urine, swallowing without difficulty, no c/o pain. Patient discharged home with family. Script and discharged instructions given to patient. Patient and family stated understanding of d/c instructions given and has an appointment with MD. Aisha Armetta Henri RN 

## 2014-11-24 NOTE — Progress Notes (Signed)
PT Cancellation Note  Patient Details Name: Nataleah Scioneaux MRN: [id number] DOB: Feb 25, 1956   Cancelled Treatment:    Reason Eval/Treat Not Completed: PT screened, no needs identified, will sign off.  Thanks.   Irwin Brakeman F 11/24/2014, 9:34 AM  [facility] [phone] (pager)

## 2014-11-28 ENCOUNTER — Encounter (HOSPITAL_COMMUNITY): Payer: Self-pay | Admitting: Neurosurgery

## 2015-09-03 ENCOUNTER — Other Ambulatory Visit: Payer: Self-pay

## 2015-09-03 DIAGNOSIS — Z1231 Encounter for screening mammogram for malignant neoplasm of breast: Secondary | ICD-10-CM

## 2015-10-28 ENCOUNTER — Ambulatory Visit
Admission: RE | Admit: 2015-10-28 | Discharge: 2015-10-28 | Disposition: A | Discharge status: Home / Self Care | Payer: 59 | Source: Ambulatory Visit

## 2015-10-28 DIAGNOSIS — Z1231 Encounter for screening mammogram for malignant neoplasm of breast: Secondary | ICD-10-CM

## 2016-07-16 ENCOUNTER — Other Ambulatory Visit: Payer: Self-pay | Admitting: Oncology

## 2016-07-16 DIAGNOSIS — Z1231 Encounter for screening mammogram for malignant neoplasm of breast: Secondary | ICD-10-CM

## 2016-09-17 ENCOUNTER — Ambulatory Visit (INDEPENDENT_AMBULATORY_CARE_PROVIDER_SITE_OTHER): Payer: Worker's Compensation | Admitting: Orthopaedic Surgery

## 2016-09-17 DIAGNOSIS — M25511 Pain in right shoulder: Secondary | ICD-10-CM | POA: Diagnosis not present

## 2016-09-17 DIAGNOSIS — G8929 Other chronic pain: Secondary | ICD-10-CM

## 2016-09-17 MED ORDER — METHYLPREDNISOLONE ACETATE 40 MG/ML IJ SUSP
40.0000 mg | INTRAMUSCULAR | Status: AC | PRN
  Administered 2016-09-17: 40 mg via INTRA_ARTICULAR

## 2016-09-17 MED ORDER — LIDOCAINE HCL 1 % IJ SOLN
3.0000 mL | INTRAMUSCULAR | Status: AC | PRN
  Administered 2016-09-17: 3 mL

## 2016-09-17 NOTE — Progress Notes (Signed)
Office Visit Note   Patient: Amanda [name]           Date of Birth: 1956/08/08           MRN: [id number] Visit Date: 09/17/2016              Requested by: Lin Landsman, MD [id number] [place], [place] [address] PCP: Kristine Garbe, MD   Assessment & Plan: Visit Diagnoses:  1. Chronic right shoulder pain     Plan: She tolerated the steroid injection in her right shoulder easily. She'll follow up as needed. A forward weight always at least 4-5 months between shoulder injections for her. As far as disability goes with her right shoulder, I feel that she should avoid any type of overhead repetitive overhead type of work and no lifting greater than 10 pounds with that right shoulder. She would benefit from likely just sedentary types of activities and work.  Follow-Up Instructions: Return if symptoms worsen or fail to improve.   Orders:  Orders Placed This Encounter  Procedures  . Large Joint Injection/Arthrocentesis   No orders of the defined types were placed in this encounter.     Procedures: Large Joint Inj Date/Time: 09/17/2016 4:24 PM Performed by: Mcarthur Rossetti Authorized by: Mcarthur Rossetti   Location:  Shoulder Site:  R subacromial bursa Needle Size:  22 G Approach:  Posterior Ultrasound Guidance: No   Fluoroscopic Guidance: No   Arthrogram: No Medications:  3 mL lidocaine 1 %; 40 mg methylPREDNISolone acetate 40 MG/ML     Clinical Data: No additional findings.   Subjective: Chief Complaint  Patient presents with  . Right Shoulder - Pain    Pain x2-32mo, patient has had recent neck surgery  We performed a right shoulder arthroscopically assisted rotator cuff repair in March 2015. She's had intermittent pain in that shoulder for some time now with limited activities that shoulder as well.  HPI  Review of Systems Negative for headache, shortness of breath, chest pain, fever, chills, nausea, vomiting  Objective: Vital  Signs: There were no vitals taken for this visit.  Physical Exam She is alert and oriented 3 Ortho Exam Examination the right shoulder shows some deficiency of the rotator cuff in terms of using more for deltoid abduct her shoulder. There is no blocks to rotation and no significant glenohumeral grind. Her rotator cuff is 4 out of 5 strength. Specialty Comments:  No specialty comments available.  Imaging: No results found.   PMFS History: Patient Active Problem List   Diagnosis Date Noted  . Cervical spondylosis without myelopathy 11/23/2014  . ALLERGIC RHINITIS, SEASONAL 11/19/2008  . HYPERLIPIDEMIA 05/24/2008  . HOT FLASHES 05/24/2008  . COLON CANCER, HX OF 05/24/2008  . CARPAL TUNNEL SYNDROME, BILATERAL, HX OF 05/24/2008   Past Medical History:  Diagnosis Date  . Anemia    Pt reports having to take iron supplements in the past but denies blood transfusion.  . Arthritis    degenerative cerv. spine  . Cancer    ca of the colon, 2005, rec'd chemo   . Headache    due to sinus problem & cerv spine   . Sickle cell trait   . Sleep apnea 2008   +sleep apnea, has CPAP- doesn't use it     No family history on file.  Past Surgical History:  Procedure Laterality Date  . ABDOMINAL HYSTERECTOMY    . ANTERIOR CERVICAL DECOMP/DISCECTOMY FUSION N/A 11/23/2014   Procedure: Cervical three-four,  Cervical four-five, Cervical five-six Anterior cervical decompression/diskectomy/fusion;  Surgeon: Erline Levine, MD;  Location: [place];  Service: Neurosurgery;  Laterality: N/A;  C3-4 C4-5 C5-6 Anterior cervical decompression/diskectomy/fusion  . CARPAL TUNNEL RELEASE Bilateral 2011  . COLON SURGERY  2005   colon resection   . EYE SURGERY     lasik bilateral   . FOOT SURGERY Left   . HERNIA REPAIR     umbilical hernia repair- 2005  . PORTACATH PLACEMENT  2005   & then later removed  . SHOULDER ARTHROSCOPY Right 01/2013   rotator cuff- still healing   . TUBAL LIGATION     Social History    Occupational History  . Not on file.   Social History Main Topics  . Smoking status: Never Smoker  . Smokeless tobacco: Never Used  . Alcohol use No  . Drug use: No  . Sexual activity: Not on file

## 2016-10-08 ENCOUNTER — Telehealth (INDEPENDENT_AMBULATORY_CARE_PROVIDER_SITE_OTHER): Payer: Self-pay | Admitting: Orthopaedic Surgery

## 2016-10-08 NOTE — Telephone Encounter (Signed)
Patients workers Production assistant, radio called requesting the note from the pts last visit. Faxe number is [phone] attn Lorine. Claim# [id number]

## 2016-10-13 NOTE — Telephone Encounter (Signed)
Faxed

## 2016-10-28 ENCOUNTER — Ambulatory Visit
Admission: RE | Admit: 2016-10-28 | Discharge: 2016-10-28 | Disposition: A | Discharge status: Home / Self Care | Payer: BLUE CROSS/BLUE SHIELD | Source: Ambulatory Visit | Attending: Oncology | Admitting: Oncology

## 2016-10-28 ENCOUNTER — Ambulatory Visit: Payer: Self-pay

## 2016-10-28 DIAGNOSIS — Z1231 Encounter for screening mammogram for malignant neoplasm of breast: Secondary | ICD-10-CM

## 2017-05-14 ENCOUNTER — Telehealth (INDEPENDENT_AMBULATORY_CARE_PROVIDER_SITE_OTHER): Payer: Self-pay | Admitting: Orthopaedic Surgery

## 2017-05-14 NOTE — Telephone Encounter (Signed)
Re faxed billing toTaylor @ [facility] [employer] [phone]

## 2017-05-26 ENCOUNTER — Other Ambulatory Visit: Payer: Self-pay | Admitting: Oncology

## 2017-05-26 DIAGNOSIS — Z1231 Encounter for screening mammogram for malignant neoplasm of breast: Secondary | ICD-10-CM

## 2017-08-18 ENCOUNTER — Ambulatory Visit (INDEPENDENT_AMBULATORY_CARE_PROVIDER_SITE_OTHER): Payer: BLUE CROSS/BLUE SHIELD

## 2017-08-18 ENCOUNTER — Ambulatory Visit (INDEPENDENT_AMBULATORY_CARE_PROVIDER_SITE_OTHER): Payer: Worker's Compensation | Admitting: Orthopaedic Surgery

## 2017-08-18 DIAGNOSIS — M65331 Trigger finger, right middle finger: Secondary | ICD-10-CM

## 2017-08-18 DIAGNOSIS — G8929 Other chronic pain: Secondary | ICD-10-CM | POA: Diagnosis not present

## 2017-08-18 DIAGNOSIS — S60031A Contusion of right middle finger without damage to nail, initial encounter: Secondary | ICD-10-CM | POA: Diagnosis not present

## 2017-08-18 DIAGNOSIS — M25511 Pain in right shoulder: Secondary | ICD-10-CM

## 2017-08-18 MED ORDER — METHYLPREDNISOLONE ACETATE 40 MG/ML IJ SUSP
40.0000 mg | INTRAMUSCULAR | Status: AC | PRN
  Administered 2017-08-18: 40 mg via INTRA_ARTICULAR

## 2017-08-18 MED ORDER — LIDOCAINE HCL 1 % IJ SOLN
3.0000 mL | INTRAMUSCULAR | Status: AC | PRN
  Administered 2017-08-18: 3 mL

## 2017-08-18 NOTE — Progress Notes (Signed)
Office Visit Note   Patient: Amanda [name]           Date of Birth: 31-Aug-1956           MRN: [id number] Visit Date: 08/18/2017              Requested by: Lin Landsman, [address] [place],  [place] [address] PCP: Lin Landsman, MD   Assessment & Plan: Visit Diagnoses:  1. Trigger finger, right middle finger   2. Chronic right shoulder pain   3. Contusion of right middle finger without damage to nail, initial encounter     Plan: As far as her middle fingers on her right hand there is no treatment is needed since the swelling has subsided and her function is normal. I told her the pain from the contusion can take a while to go away gave her reassurance that finger functionally is normal. As far as her shoulder goes I agree with her try another steroid injection the shoulder and really have nothing else to offer her other than this given the good function of the shoulder of her pain. She'll continue her follow-up with her pain specialist as well. She tolerated the steroid injection well and her right shoulder.  Follow-Up Instructions: Return if symptoms worsen or fail to improve.   Orders:  Orders Placed This Encounter  Procedures  . Large Joint Injection/Arthrocentesis  . XR Finger Middle Right   No orders of the defined types were placed in this encounter.     Procedures: Large Joint Inj Date/Time: 08/18/2017 11:17 AM Performed by: Mcarthur Rossetti Authorized by: Mcarthur Rossetti   Location:  Shoulder Site:  R subacromial bursa Ultrasound Guidance: No   Fluoroscopic Guidance: No   Arthrogram: No   Medications:  3 mL lidocaine 1 %; 40 mg methylPREDNISolone acetate 40 MG/ML     Clinical Data: No additional findings.   Subjective: No chief complaint on file. The patient is well-known to [place]. She has remote history of a right shoulder rotator cuff tear that had a repair. She has chronic pain in that shoulder since then. She comes in from time to  time for steroid injection in that shoulder. She's also had a cervical fusion done elsewhere. She had a recent fall landing on her right hand and this caused her to Hyperflex her middle finger. She's had pain and swelling in that middle finger since then it was concern that she is injured it in some way. She would like a steroid injection in her right shoulder today while she is here. Other than her chronic right shoulder pain and now her acute right middle finger injury and pain she has no other active medical issues right now. She was actually able to show me a photograph on her finding of her middle finger at the time she injured it and there was significant amount of swelling around the proximal phalanx and the PIP joint comparing the fingers around. She is in chronic pain management as well.  HPI  Review of Systems She currently denies any headache, chest pain, short of breath, fever, chills, nausea, vomiting.  Objective: Vital Signs: There were no vitals taken for this visit.  Physical Exam She is alert and oriented 3 and in no acute distress. Ortho Exam Examination of her left and right hands today shows no swelling especially at the middle finger. She has full range of motion of her right middle finger. It does hurt to palpation of the PIP  joint and proximal phalanx but there is no instability of ligaments exam and again her flexion-extension is full and she can make a full fist. There is no swelling today of the PIP or DIP joints of that finger. Her right shoulder does move fluidly but has pain throughout its arc of motion. Specialty Comments:  No specialty comments available.  Imaging: Xr Finger Middle Right  Result Date: 08/18/2017 3 views of the right hand show no fracture, dislocation, or malalignment of any of the bones of the hand that I can see. There is no abnormality of the middle finger on the right hand.    PMFS History: Patient Active Problem List   Diagnosis Date  Noted  . Chronic right shoulder pain 08/18/2017  . Cervical spondylosis without myelopathy 11/23/2014  . ALLERGIC RHINITIS, SEASONAL 11/19/2008  . HYPERLIPIDEMIA 05/24/2008  . HOT FLASHES 05/24/2008  . COLON CANCER, HX OF 05/24/2008  . CARPAL TUNNEL SYNDROME, BILATERAL, HX OF 05/24/2008   Past Medical History:  Diagnosis Date  . Anemia    Pt reports having to take iron supplements in the past but denies blood transfusion.  . Arthritis    degenerative cerv. spine  . Cancer    ca of the colon, 2005, rec'd chemo   . Headache    due to sinus problem & cerv spine   . Sickle cell trait   . Sleep apnea 2008   +sleep apnea, has CPAP- doesn't use it     No family history on file.  Past Surgical History:  Procedure Laterality Date  . ABDOMINAL HYSTERECTOMY    . ANTERIOR CERVICAL DECOMP/DISCECTOMY FUSION N/A 11/23/2014   Procedure: Cervical three-four, Cervical four-five, Cervical five-six Anterior cervical decompression/diskectomy/fusion;  Surgeon: Erline Levine, MD;  Location: [facility];  Service: Neurosurgery;  Laterality: N/A;  C3-4 C4-5 C5-6 Anterior cervical decompression/diskectomy/fusion  . CARPAL TUNNEL RELEASE Bilateral 2011  . COLON SURGERY  2005   colon resection   . EYE SURGERY     lasik bilateral   . FOOT SURGERY Left   . HERNIA REPAIR     umbilical hernia repair- 2005  . PORTACATH PLACEMENT  2005   & then later removed  . SHOULDER ARTHROSCOPY Right 01/2013   rotator cuff- still healing   . TUBAL LIGATION     Social History   Occupational History  . Not on file.   Social History Main Topics  . Smoking status: Never Smoker  . Smokeless tobacco: Never Used  . Alcohol use No  . Drug use: No  . Sexual activity: Not on file

## 2017-10-29 ENCOUNTER — Ambulatory Visit
Admission: RE | Admit: 2017-10-29 | Discharge: 2017-10-29 | Disposition: A | Discharge status: Home / Self Care | Payer: BLUE CROSS/BLUE SHIELD | Source: Ambulatory Visit | Attending: Oncology | Admitting: Oncology

## 2017-10-29 DIAGNOSIS — Z1231 Encounter for screening mammogram for malignant neoplasm of breast: Secondary | ICD-10-CM

## 2017-12-22 DIAGNOSIS — R69 Illness, unspecified: Secondary | ICD-10-CM | POA: Diagnosis not present

## 2018-02-10 DIAGNOSIS — H524 Presbyopia: Secondary | ICD-10-CM | POA: Diagnosis not present

## 2018-03-03 DIAGNOSIS — L658 Other specified nonscarring hair loss: Secondary | ICD-10-CM | POA: Diagnosis not present

## 2018-03-03 DIAGNOSIS — B351 Tinea unguium: Secondary | ICD-10-CM | POA: Diagnosis not present

## 2018-03-22 DIAGNOSIS — G8929 Other chronic pain: Secondary | ICD-10-CM | POA: Diagnosis not present

## 2018-03-22 DIAGNOSIS — Z803 Family history of malignant neoplasm of breast: Secondary | ICD-10-CM | POA: Diagnosis not present

## 2018-03-22 DIAGNOSIS — Z833 Family history of diabetes mellitus: Secondary | ICD-10-CM | POA: Diagnosis not present

## 2018-03-22 DIAGNOSIS — G629 Polyneuropathy, unspecified: Secondary | ICD-10-CM | POA: Diagnosis not present

## 2018-03-22 DIAGNOSIS — E785 Hyperlipidemia, unspecified: Secondary | ICD-10-CM | POA: Diagnosis not present

## 2018-03-22 DIAGNOSIS — Z791 Long term (current) use of non-steroidal anti-inflammatories (NSAID): Secondary | ICD-10-CM | POA: Diagnosis not present

## 2018-03-29 ENCOUNTER — Other Ambulatory Visit: Payer: Self-pay | Admitting: Neurosurgery

## 2018-04-13 ENCOUNTER — Encounter (INDEPENDENT_AMBULATORY_CARE_PROVIDER_SITE_OTHER): Payer: Self-pay | Admitting: Physician Assistant

## 2018-04-13 ENCOUNTER — Ambulatory Visit (INDEPENDENT_AMBULATORY_CARE_PROVIDER_SITE_OTHER): Payer: Medicare HMO

## 2018-04-13 ENCOUNTER — Ambulatory Visit (INDEPENDENT_AMBULATORY_CARE_PROVIDER_SITE_OTHER): Payer: Medicare HMO | Admitting: Physician Assistant

## 2018-04-13 DIAGNOSIS — M25531 Pain in right wrist: Secondary | ICD-10-CM | POA: Diagnosis not present

## 2018-04-13 DIAGNOSIS — M67431 Ganglion, right wrist: Secondary | ICD-10-CM | POA: Diagnosis not present

## 2018-04-13 NOTE — Progress Notes (Signed)
Office Visit Note   Patient: Amanda [name]           Date of Birth: [birth year]           MRN: [id number] Visit Date: 04/13/2018              Requested by: Amanda [name], Amanda [name], [place] [address] PCP: Amanda Landsman, MD   Assessment & Plan: Visit Diagnoses:  1. Pain in right wrist     Plan: Right dorsal wrist over the ulnar styloid area was prepped with Betadine then ethyl chloride was used to anesthetize the area then 1 cc of lidocaine was used to further anesthetize the area 18-gauge needle was then placed within the ganglion cyst and then specimens of gelatinous material was performed.  1 cc of Depo-Medrol and 1 cc of lidocaine was placed in the ganglion cyst region.  Ace bandage was wrapped around the wrist and hand.  Patient tolerated this well.  She will leave the compressive Ace bandage on until this evening.  She will follow-up with [place] on as-needed basis pain persist or the ganglion cyst returns.  Follow-Up Instructions: Return if symptoms worsen or fail to improve.   Orders:  Orders Placed This Encounter  Procedures  . XR Wrist 2 Views Right   No orders of the defined types were placed in this encounter.     Procedures: No procedures performed   Clinical Data: No additional findings.   Subjective: Chief Complaint  Patient presents with  . Right Wrist - Pain, Edema    HPI [name] is well-known to Dr. Ninfa [name] service comes in today due to right wrist pain and a knot that is been present for some time but is just became painful over the past 2 months.  She has had no injury.  States the cyst or mass has not really grown a lot as this became painful.  She is had no fevers or chills. Review of Systems Please see HPI otherwise negative  Objective: Vital Signs: There were no vitals taken for this visit.  Physical Exam  Constitutional: She is oriented to person, place, and time. She appears well-developed and well-nourished. No distress.    Cardiovascular: Intact distal pulses.  Pulmonary/Chest: Effort normal.  Neurological: She is alert and oriented to person, place, and time.  Skin: She is not diaphoretic.  Psychiatric: She has a normal mood and affect.    Ortho Exam Right wrist she has a highly mobile cyst over the ulnar styloid region dorsal aspect of the wrist.  No erythema ecchymosis about the wrist.  She has overall good range of motion of the wrist.  Radial pulses intact.  Motor and sensation is intact throughout the hand. Specialty Comments:  No specialty comments available.  Imaging: Xr Wrist 2 Views Right  Result Date: 04/13/2018 Right wrist 2 views: No acute fracture.  Wrist joint is well-maintained.  There is some soft tissue swelling over the ulnar styloid region.    PMFS History: Patient Active Problem List   Diagnosis Date Noted  . Chronic right shoulder pain 08/18/2017  . Cervical spondylosis without myelopathy 11/23/2014  . ALLERGIC RHINITIS, SEASONAL 11/19/2008  . HYPERLIPIDEMIA 05/24/2008  . HOT FLASHES 05/24/2008  . COLON CANCER, HX OF 05/24/2008  . CARPAL TUNNEL SYNDROME, BILATERAL, HX OF 05/24/2008   Past Medical History:  Diagnosis Date  . Anemia    Pt reports having to take iron supplements in the past but denies blood transfusion.  Marland Kitchen  Arthritis    degenerative cerv. spine  . Cancer ([place])    ca of the colon, 2005, rec'd chemo   . Headache    due to sinus problem & cerv spine   . Sickle cell trait ([place])   . Sleep apnea 2008   +sleep apnea, has CPAP- doesn't use it     Family History  Problem Relation Age of Onset  . Breast cancer Neg Hx     Past Surgical History:  Procedure Laterality Date  . ABDOMINAL HYSTERECTOMY    . ANTERIOR CERVICAL DECOMP/DISCECTOMY FUSION N/A 11/23/2014   Procedure: Cervical three-four, Cervical four-five, Cervical five-six Anterior cervical decompression/diskectomy/fusion;  Surgeon: Amanda Levine, MD;  Location: [facility];  Service: Neurosurgery;  Laterality:  N/A;  C3-4 C4-5 C5-6 Anterior cervical decompression/diskectomy/fusion  . CARPAL TUNNEL RELEASE Bilateral 2011  . COLON SURGERY  2005   colon resection   . EYE SURGERY     lasik bilateral   . FOOT SURGERY Left   . HERNIA REPAIR     umbilical hernia repair- 2005  . PORTACATH PLACEMENT  2005   & then later removed  . SHOULDER ARTHROSCOPY Right 01/2013   rotator cuff- still healing   . TUBAL LIGATION     Social History   Occupational History  . Not on file  Tobacco Use  . Smoking status: Never Smoker  . Smokeless tobacco: Never Used  Substance and Sexual Activity  . Alcohol use: No  . Drug use: No  . Sexual activity: Not on file

## 2018-04-13 NOTE — Pre-Procedure Instructions (Signed)
Amanda [name]  04/13/2018      [facility] [address] [name] [address] [name] [address] Phone: [phone] Fax: [phone]    Your procedure is scheduled on April 19, 2018.  Report to [facility] Admitting at 1100 AM.  Call this number if you have problems the morning of surgery:  [phone]   Remember:  No food or drink after midnight.   Continue all medications as directed by your physician except follow these medication instructions before surgery below   Take these medicines the morning of surgery with A SIP OF WATER  Tylenol-if needed Cetirizine (zyrtec)-if needed Cyclobenzaprine (flexeril)-if needed Gabapentin (neurontin)  7 days prior to surgery STOP taking any diclofenac (voltaren), meloxicam (mobic), Aspirin (unless otherwise instructed by your surgeon), Aleve, Naproxen, Ibuprofen, Motrin, Advil, Goody's, BC's, all herbal medications, fish oil, and all vitamins    Do not wear jewelry, make-up or nail polish.  Do not wear lotions, powders, or perfumes, or deodorant.  Do not shave 48 hours prior to surgery.   Do not bring valuables to the hospital.  [facility] is not responsible for any belongings or valuables.  Contacts, dentures or bridgework may not be worn into surgery.  Leave your suitcase in the car.  After surgery it may be brought to your room.  For patients admitted to the hospital, discharge time will be determined by your treatment team.  Patients discharged the day of surgery will not be allowed to drive home.    [place]- Preparing For Surgery  Before surgery, you can play an important role. Because skin is not sterile, your skin needs to be as free of germs as possible. You can reduce the number of germs on your skin by washing with CHG (chlorahexidine gluconate) Soap before surgery.  CHG is an antiseptic cleaner which kills germs and bonds with  the skin to continue killing germs even after washing.    Oral Hygiene is also important to reduce your risk of infection.  Remember - BRUSH YOUR TEETH THE MORNING OF SURGERY WITH YOUR REGULAR TOOTHPASTE  Please do not use if you have an allergy to CHG or antibacterial soaps. If your skin becomes reddened/irritated stop using the CHG.  Do not shave (including legs and underarms) for at least 48 hours prior to first CHG shower. It is OK to shave your face.  Please follow these instructions carefully.   1. Shower the NIGHT BEFORE SURGERY and the MORNING OF SURGERY with CHG.   2. If you chose to wash your hair, wash your hair first as usual with your normal shampoo.  3. After you shampoo, rinse your hair and body thoroughly to remove the shampoo.  4. Use CHG as you would any other liquid soap. You can apply CHG directly to the skin and wash gently with a scrungie or a clean washcloth.   5. Apply the CHG Soap to your body ONLY FROM THE NECK DOWN.  Do not use on open wounds or open sores. Avoid contact with your eyes, ears, mouth and genitals (private parts). Wash Face and genitals (private parts)  with your normal soap.  6. Wash thoroughly, paying special attention to the area where your surgery will be performed.  7. Thoroughly rinse your body with warm water from the neck down.  8. DO NOT shower/wash with your normal soap after using and rinsing off  the CHG Soap.  9. Pat yourself dry with a CLEAN TOWEL.  10. Wear CLEAN PAJAMAS to bed the night before surgery, wear comfortable clothes the morning of surgery  11. Place CLEAN SHEETS on your bed the night of your first shower and DO NOT SLEEP WITH PETS.  Day of Surgery:  Do not apply any deodorants/lotions.  Please wear clean clothes to the hospital/surgery center.   Remember to brush your teeth WITH YOUR REGULAR TOOTHPASTE.  Please read over the following fact sheets that you were given. Pain Booklet, Coughing and Deep Breathing,  MRSA Information and Surgical Site Infection Prevention

## 2018-04-14 ENCOUNTER — Encounter (HOSPITAL_COMMUNITY): Payer: Self-pay

## 2018-04-14 ENCOUNTER — Encounter (HOSPITAL_COMMUNITY)
Admission: RE | Admit: 2018-04-14 | Discharge: 2018-04-14 | Disposition: A | Discharge status: Home / Self Care | Payer: No Typology Code available for payment source | Source: Ambulatory Visit | Attending: Neurosurgery | Admitting: Neurosurgery

## 2018-04-14 DIAGNOSIS — Z01812 Encounter for preprocedural laboratory examination: Secondary | ICD-10-CM | POA: Insufficient documentation

## 2018-04-14 HISTORY — DX: Anxiety disorder, unspecified: F41.9

## 2018-04-14 HISTORY — DX: Hyperlipidemia, unspecified: E78.5

## 2018-04-14 LAB — CBC
HEMATOCRIT: 41.4 % (ref 36.0–46.0)
Hemoglobin: 13.3 g/dL (ref 12.0–15.0)
MCH: 28.7 pg (ref 26.0–34.0)
MCHC: 32.1 g/dL (ref 30.0–36.0)
MCV: 89.4 fL (ref 78.0–100.0)
Platelets: 336 10*3/uL (ref 150–400)
RBC: 4.63 MIL/uL (ref 3.87–5.11)
RDW: 12.9 % (ref 11.5–15.5)
WBC: 6.7 10*3/uL (ref 4.0–10.5)

## 2018-04-14 LAB — TYPE AND SCREEN
ABO/RH(D): A POS
Antibody Screen: NEGATIVE

## 2018-04-14 LAB — BASIC METABOLIC PANEL
Anion gap: 9 (ref 5–15)
BUN: 13 mg/dL (ref 6–20)
CHLORIDE: 104 mmol/L (ref 101–111)
CO2: 26 mmol/L (ref 22–32)
Calcium: 9.5 mg/dL (ref 8.9–10.3)
Creatinine, Ser: 0.73 mg/dL (ref 0.44–1.00)
GFR calc non Af Amer: >60 mL/min (ref >60)
GLUCOSE: 88 mg/dL (ref 65–99)
Potassium: 5.1 mmol/L (ref 3.5–5.1)
Sodium: 139 mmol/L (ref 135–145)

## 2018-04-14 LAB — SURGICAL PCR SCREEN
MRSA, PCR: NEGATIVE
STAPHYLOCOCCUS AUREUS: NEGATIVE

## 2018-04-14 NOTE — Progress Notes (Signed)
Pt. Denies any cardiac history has never seen a cardiologist  Denies any Cardiac testing

## 2018-04-15 NOTE — Progress Notes (Signed)
Amanda [name] called and asked about medication to take, she reported what medications to stop and which ones to take the morning of surgery, "what about my Simvastatin , is it ok to not take it until the day of surgery, cause she didn't say anything about it." I asked patient if she takes Simvastatin in the evening, she responded "yes".  I explained that the reason that she di not mention it is because it is not a medication you take in the morning , but you continue to take it as scheduled. Patient voiced understanding.

## 2018-04-19 ENCOUNTER — Encounter (HOSPITAL_COMMUNITY): Payer: Self-pay | Admitting: Surgery

## 2018-04-19 ENCOUNTER — Inpatient Hospital Stay (HOSPITAL_COMMUNITY)
Admission: RE | Admit: 2018-04-19 | Discharge: 2018-04-21 | DRG: 455 | Disposition: A | Discharge status: Home Health | Payer: No Typology Code available for payment source | Attending: Neurosurgery | Admitting: Neurosurgery

## 2018-04-19 ENCOUNTER — Encounter (HOSPITAL_COMMUNITY)
Admission: RE | Disposition: A | Discharge status: Home Health | Payer: Self-pay | Source: Home / Self Care | Attending: Neurosurgery

## 2018-04-19 ENCOUNTER — Inpatient Hospital Stay (HOSPITAL_COMMUNITY): Payer: No Typology Code available for payment source | Admitting: Certified Registered Nurse Anesthetist

## 2018-04-19 ENCOUNTER — Inpatient Hospital Stay (HOSPITAL_COMMUNITY): Payer: No Typology Code available for payment source

## 2018-04-19 ENCOUNTER — Other Ambulatory Visit: Payer: Self-pay

## 2018-04-19 DIAGNOSIS — M4317 Spondylolisthesis, lumbosacral region: Secondary | ICD-10-CM | POA: Diagnosis present

## 2018-04-19 DIAGNOSIS — M4316 Spondylolisthesis, lumbar region: Secondary | ICD-10-CM | POA: Diagnosis present

## 2018-04-19 DIAGNOSIS — R200 Anesthesia of skin: Secondary | ICD-10-CM | POA: Diagnosis present

## 2018-04-19 DIAGNOSIS — M48061 Spinal stenosis, lumbar region without neurogenic claudication: Secondary | ICD-10-CM | POA: Diagnosis present

## 2018-04-19 DIAGNOSIS — M4807 Spinal stenosis, lumbosacral region: Secondary | ICD-10-CM | POA: Diagnosis present

## 2018-04-19 DIAGNOSIS — M5117 Intervertebral disc disorders with radiculopathy, lumbosacral region: Secondary | ICD-10-CM | POA: Diagnosis present

## 2018-04-19 DIAGNOSIS — M4306 Spondylolysis, lumbar region: Secondary | ICD-10-CM

## 2018-04-19 DIAGNOSIS — Z6827 Body mass index (BMI) 27.0-27.9, adult: Secondary | ICD-10-CM

## 2018-04-19 DIAGNOSIS — Z791 Long term (current) use of non-steroidal anti-inflammatories (NSAID): Secondary | ICD-10-CM

## 2018-04-19 DIAGNOSIS — M5116 Intervertebral disc disorders with radiculopathy, lumbar region: Secondary | ICD-10-CM | POA: Diagnosis present

## 2018-04-19 DIAGNOSIS — M4126 Other idiopathic scoliosis, lumbar region: Secondary | ICD-10-CM | POA: Diagnosis present

## 2018-04-19 HISTORY — PX: MAXIMUM ACCESS (MAS)POSTERIOR LUMBAR INTERBODY FUSION (PLIF) 2 LEVEL: SHX6369

## 2018-04-19 SURGERY — FOR MAXIMUM ACCESS (MAS) POSTERIOR LUMBAR INTERBODY FUSION (PLIF) 2 LEVEL
Anesthesia: General | Site: Back

## 2018-04-19 MED ORDER — PHENYLEPHRINE 40 MCG/ML (10ML) SYRINGE FOR IV PUSH (FOR BLOOD PRESSURE SUPPORT)
PREFILLED_SYRINGE | INTRAVENOUS | Status: AC
  Filled 2018-04-19: qty 10

## 2018-04-19 MED ORDER — FENTANYL CITRATE (PF) 250 MCG/5ML IJ SOLN
INTRAMUSCULAR | Status: DC | PRN
  Administered 2018-04-19: 75 ug via INTRAVENOUS
  Administered 2018-04-19: 50 ug via INTRAVENOUS
  Administered 2018-04-19: 75 ug via INTRAVENOUS
  Administered 2018-04-19: 50 ug via INTRAVENOUS

## 2018-04-19 MED ORDER — LORATADINE 10 MG PO TABS
10.0000 mg | ORAL_TABLET | Freq: Every day | ORAL | Status: DC
  Administered 2018-04-20: 10 mg via ORAL
  Filled 2018-04-19: qty 1

## 2018-04-19 MED ORDER — EPHEDRINE SULFATE 50 MG/ML IJ SOLN
INTRAMUSCULAR | Status: DC | PRN
  Administered 2018-04-19 (×2): 5 mg via INTRAVENOUS

## 2018-04-19 MED ORDER — VITAMIN E 180 MG (400 UNIT) PO CAPS
400.0000 [IU] | ORAL_CAPSULE | Freq: Every day | ORAL | Status: DC
Start: 2018-04-20 — End: 2018-04-21
  Administered 2018-04-20: 400 [IU] via ORAL
  Filled 2018-04-19 (×2): qty 1

## 2018-04-19 MED ORDER — VITAMIN D 1000 UNITS PO TABS
1000.0000 [IU] | ORAL_TABLET | Freq: Every day | ORAL | Status: DC
  Administered 2018-04-20: 1000 [IU] via ORAL
  Filled 2018-04-19: qty 1

## 2018-04-19 MED ORDER — 0.9 % SODIUM CHLORIDE (POUR BTL) OPTIME
TOPICAL | Status: DC | PRN
  Administered 2018-04-19: 1000 mL

## 2018-04-19 MED ORDER — LIDOCAINE 2% (20 MG/ML) 5 ML SYRINGE
INTRAMUSCULAR | Status: AC
  Filled 2018-04-19: qty 5

## 2018-04-19 MED ORDER — ONDANSETRON HCL 4 MG PO TABS: 4.0000 mg | ORAL_TABLET | Freq: Four times a day (QID) | ORAL | Status: DC | PRN

## 2018-04-19 MED ORDER — ALUM & MAG HYDROXIDE-SIMETH 200-200-20 MG/5ML PO SUSP: 30.0000 mL | Freq: Four times a day (QID) | ORAL | Status: DC | PRN

## 2018-04-19 MED ORDER — LIDOCAINE-EPINEPHRINE 1 %-1:100000 IJ SOLN
INTRAMUSCULAR | Status: AC
  Filled 2018-04-19: qty 1

## 2018-04-19 MED ORDER — CYCLOBENZAPRINE HCL 10 MG PO TABS: 10.0000 mg | ORAL_TABLET | Freq: Three times a day (TID) | ORAL | Status: DC | PRN

## 2018-04-19 MED ORDER — NEOMYCIN-POLYMYXIN-HC 3.5-10000-1 OT SUSP: 4.0000 [drp] | Freq: Every day | OTIC | Status: DC | PRN

## 2018-04-19 MED ORDER — ACETAMINOPHEN 500 MG PO TABS: 1000.0000 mg | ORAL_TABLET | Freq: Four times a day (QID) | ORAL | Status: DC | PRN

## 2018-04-19 MED ORDER — ONDANSETRON HCL 4 MG/2ML IJ SOLN
INTRAMUSCULAR | Status: DC | PRN
  Administered 2018-04-19: 4 mg via INTRAVENOUS

## 2018-04-19 MED ORDER — MENTHOL 3 MG MT LOZG: 1.0000 | LOZENGE | OROMUCOSAL | Status: DC | PRN

## 2018-04-19 MED ORDER — MORPHINE SULFATE (PF) 2 MG/ML IV SOLN: 2.0000 mg | INTRAVENOUS | Status: DC | PRN

## 2018-04-19 MED ORDER — DIPHENHYDRAMINE HCL 25 MG PO CAPS: 25.0000 mg | ORAL_CAPSULE | Freq: Every day | ORAL | Status: DC | PRN

## 2018-04-19 MED ORDER — ONDANSETRON HCL 4 MG/2ML IJ SOLN: 4.0000 mg | Freq: Four times a day (QID) | INTRAMUSCULAR | Status: DC | PRN

## 2018-04-19 MED ORDER — SODIUM CHLORIDE 0.9% FLUSH: 3.0000 mL | INTRAVENOUS | Status: DC | PRN

## 2018-04-19 MED ORDER — CEFAZOLIN SODIUM-DEXTROSE 2-4 GM/100ML-% IV SOLN
2.0000 g | INTRAVENOUS | Status: AC
  Administered 2018-04-19: 2 g via INTRAVENOUS
  Filled 2018-04-19: qty 100

## 2018-04-19 MED ORDER — SUCCINYLCHOLINE CHLORIDE 200 MG/10ML IV SOSY
PREFILLED_SYRINGE | INTRAVENOUS | Status: AC
  Filled 2018-04-19: qty 10

## 2018-04-19 MED ORDER — EYE VITAMINS & MINERALS PO TABS: 1.0000 | ORAL_TABLET | Freq: Every day | ORAL | Status: DC

## 2018-04-19 MED ORDER — MIDAZOLAM HCL 5 MG/5ML IJ SOLN
INTRAMUSCULAR | Status: DC | PRN
  Administered 2018-04-19 (×2): 1 mg via INTRAVENOUS

## 2018-04-19 MED ORDER — PHENOL 1.4 % MT LIQD: 1.0000 | OROMUCOSAL | Status: DC | PRN

## 2018-04-19 MED ORDER — BUPIVACAINE LIPOSOME 1.3 % IJ SUSP
20.0000 mL | INTRAMUSCULAR | Status: DC
  Filled 2018-04-19: qty 20

## 2018-04-19 MED ORDER — GABAPENTIN 600 MG PO TABS
600.0000 mg | ORAL_TABLET | Freq: Two times a day (BID) | ORAL | Status: DC
  Administered 2018-04-19 – 2018-04-20 (×3): 600 mg via ORAL
  Filled 2018-04-19 (×3): qty 1

## 2018-04-19 MED ORDER — PROPOFOL 1000 MG/100ML IV EMUL
INTRAVENOUS | Status: AC
  Filled 2018-04-19: qty 200

## 2018-04-19 MED ORDER — HYDROMORPHONE HCL 2 MG/ML IJ SOLN: 0.3000 mg | INTRAMUSCULAR | Status: DC | PRN

## 2018-04-19 MED ORDER — BOOST / RESOURCE BREEZE PO LIQD CUSTOM
1.0000 | Freq: Three times a day (TID) | ORAL | Status: DC
  Administered 2018-04-19: 1 via ORAL
  Filled 2018-04-19 (×3): qty 1

## 2018-04-19 MED ORDER — BLACK COHOSH 40 MG PO CAPS: 40.0000 mg | ORAL_CAPSULE | Freq: Every day | ORAL | Status: DC

## 2018-04-19 MED ORDER — PROPOFOL 500 MG/50ML IV EMUL
INTRAVENOUS | Status: DC | PRN
  Administered 2018-04-19: 25 ug/kg/min via INTRAVENOUS

## 2018-04-19 MED ORDER — KCL IN DEXTROSE-NACL 20-5-0.45 MEQ/L-%-% IV SOLN: INTRAVENOUS | Status: DC

## 2018-04-19 MED ORDER — BIOTIN 5000 MCG PO TABS: 5000.0000 ug | ORAL_TABLET | Freq: Every day | ORAL | Status: DC

## 2018-04-19 MED ORDER — DEXTROSE 5 % IV SOLN
500.0000 mg | Freq: Four times a day (QID) | INTRAVENOUS | Status: DC | PRN
Start: 2018-04-19 — End: 2018-04-21
  Filled 2018-04-19: qty 5

## 2018-04-19 MED ORDER — BUPIVACAINE HCL (PF) 0.5 % IJ SOLN
INTRAMUSCULAR | Status: AC
  Filled 2018-04-19: qty 30

## 2018-04-19 MED ORDER — FLEET ENEMA 7-19 GM/118ML RE ENEM
1.0000 | ENEMA | Freq: Once | RECTAL | Status: DC | PRN
Start: 2018-04-19 — End: 2018-04-21

## 2018-04-19 MED ORDER — FENTANYL CITRATE (PF) 250 MCG/5ML IJ SOLN
INTRAMUSCULAR | Status: AC
  Filled 2018-04-19: qty 5

## 2018-04-19 MED ORDER — THROMBIN 5000 UNITS EX SOLR
OROMUCOSAL | Status: DC | PRN
  Administered 2018-04-19: 14:00:00 via TOPICAL

## 2018-04-19 MED ORDER — PHENYLEPHRINE HCL 10 MG/ML IJ SOLN
INTRAVENOUS | Status: DC | PRN
  Administered 2018-04-19: 20 ug/min via INTRAVENOUS

## 2018-04-19 MED ORDER — LABETALOL HCL 5 MG/ML IV SOLN
INTRAVENOUS | Status: AC
  Filled 2018-04-19: qty 4

## 2018-04-19 MED ORDER — CHLORHEXIDINE GLUCONATE CLOTH 2 % EX PADS: 6.0000 | MEDICATED_PAD | Freq: Once | CUTANEOUS | Status: DC

## 2018-04-19 MED ORDER — LIDOCAINE HCL (CARDIAC) PF 100 MG/5ML IV SOSY
PREFILLED_SYRINGE | INTRAVENOUS | Status: DC | PRN
  Administered 2018-04-19: 80 mg via INTRAVENOUS

## 2018-04-19 MED ORDER — SUFENTANIL CITRATE 50 MCG/ML IV SOLN
INTRAVENOUS | Status: AC
  Filled 2018-04-19: qty 1

## 2018-04-19 MED ORDER — BISACODYL 10 MG RE SUPP: 10.0000 mg | Freq: Every day | RECTAL | Status: DC | PRN

## 2018-04-19 MED ORDER — B-12 2500 MCG PO TABS: 2500.0000 ug | ORAL_TABLET | Freq: Every day | ORAL | Status: DC

## 2018-04-19 MED ORDER — CEFAZOLIN SODIUM-DEXTROSE 2-4 GM/100ML-% IV SOLN
2.0000 g | Freq: Three times a day (TID) | INTRAVENOUS | Status: AC
  Administered 2018-04-19 – 2018-04-20 (×2): 2 g via INTRAVENOUS
  Filled 2018-04-19 (×2): qty 100

## 2018-04-19 MED ORDER — LIDOCAINE-EPINEPHRINE 1 %-1:100000 IJ SOLN
INTRAMUSCULAR | Status: DC | PRN
  Administered 2018-04-19: 5 mL

## 2018-04-19 MED ORDER — LACTATED RINGERS IV SOLN
INTRAVENOUS | Status: DC
  Administered 2018-04-19: 13:00:00 via INTRAVENOUS

## 2018-04-19 MED ORDER — POLYETHYLENE GLYCOL 3350 17 G PO PACK: 17.0000 g | PACK | Freq: Every day | ORAL | Status: DC | PRN

## 2018-04-19 MED ORDER — TURMERIC 500 MG PO TABS: 500.0000 mg | ORAL_TABLET | Freq: Every day | ORAL | Status: DC

## 2018-04-19 MED ORDER — SODIUM CHLORIDE 0.9% FLUSH
3.0000 mL | Freq: Two times a day (BID) | INTRAVENOUS | Status: DC
  Administered 2018-04-19: 3 mL via INTRAVENOUS

## 2018-04-19 MED ORDER — PROPOFOL 10 MG/ML IV BOLUS
INTRAVENOUS | Status: DC | PRN
  Administered 2018-04-19: 20 mg via INTRAVENOUS
  Administered 2018-04-19: 150 mg via INTRAVENOUS

## 2018-04-19 MED ORDER — THROMBIN 5000 UNITS EX SOLR
CUTANEOUS | Status: AC
  Filled 2018-04-19: qty 5000

## 2018-04-19 MED ORDER — METHOCARBAMOL 500 MG PO TABS
500.0000 mg | ORAL_TABLET | Freq: Four times a day (QID) | ORAL | Status: DC | PRN
  Administered 2018-04-19 – 2018-04-21 (×5): 500 mg via ORAL
  Filled 2018-04-19 (×5): qty 1

## 2018-04-19 MED ORDER — SUCCINYLCHOLINE CHLORIDE 20 MG/ML IJ SOLN
INTRAMUSCULAR | Status: DC | PRN
  Administered 2018-04-19: 120 mg via INTRAVENOUS

## 2018-04-19 MED ORDER — SIMVASTATIN 20 MG PO TABS
20.0000 mg | ORAL_TABLET | Freq: Every evening | ORAL | Status: DC
  Administered 2018-04-19: 20 mg via ORAL
  Filled 2018-04-19: qty 1

## 2018-04-19 MED ORDER — ACETAMINOPHEN 650 MG RE SUPP: 650.0000 mg | RECTAL | Status: DC | PRN

## 2018-04-19 MED ORDER — FAMOTIDINE IN NACL 20-0.9 MG/50ML-% IV SOLN: 20.0000 mg | Freq: Two times a day (BID) | INTRAVENOUS | Status: DC

## 2018-04-19 MED ORDER — OXYCODONE HCL 5 MG PO TABS: 5.0000 mg | ORAL_TABLET | Freq: Once | ORAL | Status: DC | PRN

## 2018-04-19 MED ORDER — LACTATED RINGERS IV SOLN
INTRAVENOUS | Status: DC | PRN
  Administered 2018-04-19: 13:00:00 via INTRAVENOUS

## 2018-04-19 MED ORDER — ACETAMINOPHEN 325 MG PO TABS
650.0000 mg | ORAL_TABLET | ORAL | Status: DC | PRN
  Administered 2018-04-20 – 2018-04-21 (×3): 650 mg via ORAL
  Filled 2018-04-19 (×3): qty 2

## 2018-04-19 MED ORDER — FOLIC ACID 400 MCG PO TABS: 400.0000 ug | ORAL_TABLET | Freq: Every day | ORAL | Status: DC

## 2018-04-19 MED ORDER — MIDAZOLAM HCL 2 MG/2ML IJ SOLN
INTRAMUSCULAR | Status: AC
  Filled 2018-04-19: qty 2

## 2018-04-19 MED ORDER — SUFENTANIL CITRATE 50 MCG/ML IV SOLN
INTRAVENOUS | Status: DC | PRN
  Administered 2018-04-19 (×2): 10 ug via INTRAVENOUS

## 2018-04-19 MED ORDER — OXYCODONE HCL 5 MG PO TABS
5.0000 mg | ORAL_TABLET | ORAL | Status: DC | PRN
  Administered 2018-04-19 – 2018-04-21 (×5): 5 mg via ORAL
  Filled 2018-04-19 (×5): qty 1

## 2018-04-19 MED ORDER — RED YEAST RICE 600 MG PO TABS: 600.0000 mg | ORAL_TABLET | Freq: Every day | ORAL | Status: DC

## 2018-04-19 MED ORDER — OXYCODONE HCL 5 MG PO TABS
10.0000 mg | ORAL_TABLET | ORAL | Status: DC | PRN
  Administered 2018-04-20: 10 mg via ORAL
  Filled 2018-04-19: qty 2

## 2018-04-19 MED ORDER — DOCUSATE SODIUM 100 MG PO CAPS
100.0000 mg | ORAL_CAPSULE | Freq: Two times a day (BID) | ORAL | Status: DC
  Administered 2018-04-19 – 2018-04-20 (×3): 100 mg via ORAL
  Filled 2018-04-19 (×3): qty 1

## 2018-04-19 MED ORDER — DEXAMETHASONE SODIUM PHOSPHATE 4 MG/ML IJ SOLN
INTRAMUSCULAR | Status: DC | PRN
  Administered 2018-04-19: 5 mg via INTRAVENOUS

## 2018-04-19 MED ORDER — OXYCODONE HCL 5 MG/5ML PO SOLN: 5.0000 mg | Freq: Once | ORAL | Status: DC | PRN

## 2018-04-19 MED ORDER — FAMOTIDINE 20 MG PO TABS
20.0000 mg | ORAL_TABLET | Freq: Two times a day (BID) | ORAL | Status: DC
  Administered 2018-04-19 – 2018-04-20 (×3): 20 mg via ORAL
  Filled 2018-04-19 (×3): qty 1

## 2018-04-19 MED ORDER — BUPIVACAINE HCL (PF) 0.5 % IJ SOLN
INTRAMUSCULAR | Status: DC | PRN
  Administered 2018-04-19: 5 mL

## 2018-04-19 MED ORDER — PHENYLEPHRINE HCL 10 MG/ML IJ SOLN
INTRAMUSCULAR | Status: DC | PRN
  Administered 2018-04-19 (×2): 80 ug via INTRAVENOUS

## 2018-04-19 MED ORDER — ZOLPIDEM TARTRATE 5 MG PO TABS: 5.0000 mg | ORAL_TABLET | Freq: Every evening | ORAL | Status: DC | PRN

## 2018-04-19 SURGICAL SUPPLY — 86 items
ADH SKN CLS APL DERMABOND .7 (GAUZE/BANDAGES/DRESSINGS) ×1
BASKET BONE COLLECTION (BASKET) ×1 IMPLANT
BLADE CLIPPER SURG (BLADE) IMPLANT
BONE CANC CHIPS 20CC PCAN1/4 (Bone Implant) ×2 IMPLANT
BUR MATCHSTICK NEURO 3.0 LAGG (BURR) ×2 IMPLANT
BUR PRECISION FLUTE 5.0 (BURR) IMPLANT
BUR ROUND FLUTED 5 RND (BURR) ×2 IMPLANT
CAGE COROENT MP 8X9X23M-8 SPIN (Cage) ×2 IMPLANT
CAGE PLIF 8X9X23-12 LUMBAR (Cage) ×2 IMPLANT
CANISTER SUCT 3000ML PPV (MISCELLANEOUS) ×2 IMPLANT
CAP RELINE MOD TULIP RMM (Cap) ×2 IMPLANT
CARTRIDGE OIL MAESTRO DRILL (MISCELLANEOUS) ×1 IMPLANT
CHIPS CANC BONE 20CC PCAN1/4 (Bone Implant) ×1 IMPLANT
CLIP NEUROVISION LG (CLIP) ×1 IMPLANT
CONT SPEC 4OZ CLIKSEAL STRL BL (MISCELLANEOUS) ×2 IMPLANT
COVER BACK TABLE 60X90IN (DRAPES) ×2 IMPLANT
DECANTER SPIKE VIAL GLASS SM (MISCELLANEOUS) ×2 IMPLANT
DERMABOND ADVANCED (GAUZE/BANDAGES/DRESSINGS) ×1
DERMABOND ADVANCED .7 DNX12 (GAUZE/BANDAGES/DRESSINGS) ×1 IMPLANT
DIFFUSER DRILL AIR PNEUMATIC (MISCELLANEOUS) ×2 IMPLANT
DRAPE C-ARM 42X72 X-RAY (DRAPES) ×2 IMPLANT
DRAPE C-ARMOR (DRAPES) ×2 IMPLANT
DRAPE LAPAROTOMY 100X72X124 (DRAPES) ×2 IMPLANT
DRAPE SURG 17X23 STRL (DRAPES) ×2 IMPLANT
DRSG OPSITE POSTOP 4X6 (GAUZE/BANDAGES/DRESSINGS) ×1 IMPLANT
DURAPREP 26ML APPLICATOR (WOUND CARE) ×2 IMPLANT
ELECT BLADE 4.0 EZ CLEAN MEGAD (MISCELLANEOUS) ×2
ELECT REM PT RETURN 9FT ADLT (ELECTROSURGICAL) ×2
ELECTRODE BLDE 4.0 EZ CLN MEGD (MISCELLANEOUS) IMPLANT
ELECTRODE REM PT RTRN 9FT ADLT (ELECTROSURGICAL) ×1 IMPLANT
EVACUATOR 1/8 PVC DRAIN (DRAIN) IMPLANT
GAUZE SPONGE 4X4 12PLY STRL (GAUZE/BANDAGES/DRESSINGS) ×2 IMPLANT
GAUZE SPONGE 4X4 16PLY XRAY LF (GAUZE/BANDAGES/DRESSINGS) IMPLANT
GLOVE BIO SURGEON STRL SZ8 (GLOVE) ×4 IMPLANT
GLOVE BIOGEL PI IND STRL 8 (GLOVE) ×2 IMPLANT
GLOVE BIOGEL PI IND STRL 8.5 (GLOVE) ×2 IMPLANT
GLOVE BIOGEL PI INDICATOR 8 (GLOVE) ×2
GLOVE BIOGEL PI INDICATOR 8.5 (GLOVE) ×2
GLOVE ECLIPSE 8.0 STRL XLNG CF (GLOVE) ×4 IMPLANT
GLOVE EXAM NITRILE LRG STRL (GLOVE) IMPLANT
GLOVE EXAM NITRILE XL STR (GLOVE) IMPLANT
GLOVE EXAM NITRILE XS STR PU (GLOVE) IMPLANT
GOWN STRL REUS W/ TWL LRG LVL3 (GOWN DISPOSABLE) IMPLANT
GOWN STRL REUS W/ TWL XL LVL3 (GOWN DISPOSABLE) ×3 IMPLANT
GOWN STRL REUS W/TWL 2XL LVL3 (GOWN DISPOSABLE) ×4 IMPLANT
GOWN STRL REUS W/TWL LRG LVL3 (GOWN DISPOSABLE)
GOWN STRL REUS W/TWL XL LVL3 (GOWN DISPOSABLE) ×6
GRAFT BNE CANC CHIPS 1-8 20CC (Bone Implant) IMPLANT
HEMOSTAT POWDER KIT SURGIFOAM (HEMOSTASIS) ×2 IMPLANT
KIT BASIN OR (CUSTOM PROCEDURE TRAY) ×2 IMPLANT
KIT POSITION SURG JACKSON T1 (MISCELLANEOUS) ×2 IMPLANT
KIT TURNOVER KIT B (KITS) ×2 IMPLANT
MILL MEDIUM DISP (BLADE) IMPLANT
MODULE NVM5 NEXT GEN EMG (NEEDLE) ×1 IMPLANT
NDL HYPO 25X1 1.5 SAFETY (NEEDLE) ×1 IMPLANT
NDL SPNL 18GX3.5 QUINCKE PK (NEEDLE) IMPLANT
NEEDLE HYPO 21X1.5 SAFETY (NEEDLE) ×2 IMPLANT
NEEDLE HYPO 25X1 1.5 SAFETY (NEEDLE) ×2 IMPLANT
NEEDLE SPNL 18GX3.5 QUINCKE PK (NEEDLE) IMPLANT
NS IRRIG 1000ML POUR BTL (IV SOLUTION) ×2 IMPLANT
OIL CARTRIDGE MAESTRO DRILL (MISCELLANEOUS) ×2
PACK LAMINECTOMY NEURO (CUSTOM PROCEDURE TRAY) ×2 IMPLANT
PAD ARMBOARD 7.5X6 YLW CONV (MISCELLANEOUS) ×6 IMPLANT
PATTIES SURGICAL .5 X.5 (GAUZE/BANDAGES/DRESSINGS) IMPLANT
PATTIES SURGICAL .5 X1 (DISPOSABLE) IMPLANT
PATTIES SURGICAL 1X1 (DISPOSABLE) IMPLANT
ROD RELINE O COCR 5.0X50MM (Rod) ×1 IMPLANT
ROD RELINE-O COCR 5.0X55MM (Rod) ×1 IMPLANT
SCREW LOCK RSS 4.5/5.0MM (Screw) ×6 IMPLANT
SCREW RMM POLY 5.5X30MM 4S (Screw) ×4 IMPLANT
SCREW SHANK RELINE MOD 5.0X30 (Screw) ×2 IMPLANT
SPONGE LAP 4X18 RFD (DISPOSABLE) IMPLANT
SPONGE SURGIFOAM ABS GEL 100 (HEMOSTASIS) IMPLANT
STAPLER SKIN PROX WIDE 3.9 (STAPLE) IMPLANT
SUT VIC AB 1 CT1 18XBRD ANBCTR (SUTURE) ×2 IMPLANT
SUT VIC AB 1 CT1 8-18 (SUTURE) ×4
SUT VIC AB 2-0 CT1 18 (SUTURE) ×4 IMPLANT
SUT VIC AB 3-0 SH 8-18 (SUTURE) ×4 IMPLANT
SYR 20CC LL (SYRINGE) ×2 IMPLANT
SYR 3ML LL SCALE MARK (SYRINGE) ×8 IMPLANT
SYR 5ML LL (SYRINGE) IMPLANT
TOWEL GREEN STERILE (TOWEL DISPOSABLE) ×2 IMPLANT
TOWEL GREEN STERILE FF (TOWEL DISPOSABLE) ×2 IMPLANT
TRAP SPECIMEN MUCOUS 40CC (MISCELLANEOUS) ×1 IMPLANT
TRAY FOLEY MTR SLVR 16FR STAT (SET/KITS/TRAYS/PACK) ×2 IMPLANT
WATER STERILE IRR 1000ML POUR (IV SOLUTION) ×2 IMPLANT

## 2018-04-19 NOTE — Anesthesia Procedure Notes (Signed)
Procedure Name: Intubation Date/Time: 04/19/2018 12:07 PM Performed by: Glynda Jaeger, CRNA Pre-anesthesia Checklist: Patient identified, Patient being monitored, Timeout performed, Emergency Drugs available and Suction available Patient Re-evaluated:Patient Re-evaluated prior to induction Oxygen Delivery Method: Circle System Utilized Preoxygenation: Pre-oxygenation with 100% oxygen Induction Type: IV induction Ventilation: Mask ventilation without difficulty Laryngoscope Size: Mac and 3 Grade View: Grade I Tube type: Oral Tube size: 7.5 mm Number of attempts: 1 Airway Equipment and Method: Stylet Placement Confirmation: ETT inserted through vocal cords under direct vision,  positive ETCO2 and breath sounds checked- equal and bilateral Secured at: 21 cm Tube secured with: Tape Dental Injury: Teeth and Oropharynx as per pre-operative assessment

## 2018-04-19 NOTE — Anesthesia Postprocedure Evaluation (Signed)
Anesthesia Post Note  Patient: Amanda [name]  Procedure(s) Performed: L4-5 L5-S1 Maximum access posterior lumbar interbody fusion (N/A Back)     Patient location during evaluation: PACU Anesthesia Type: General Level of consciousness: awake and alert Pain management: pain level controlled Vital Signs Assessment: post-procedure vital signs reviewed and stable Respiratory status: spontaneous breathing, nonlabored ventilation, respiratory function stable and patient connected to nasal cannula oxygen Cardiovascular status: blood pressure returned to baseline and stable Postop Assessment: no apparent nausea or vomiting Anesthetic complications: no    Last Vitals:  Vitals:   04/19/18 1713 04/19/18 1723  BP: (!) 117/58 130/73  Pulse: 94 93  Resp: 17 13  Temp:  36.5 C  SpO2: 100% 100%    Last Pain:  Vitals:   04/19/18 1745  TempSrc:   PainSc: 3                  Effie Berkshire

## 2018-04-19 NOTE — Progress Notes (Signed)
Awake, alert, conversant.  MAEW with good power.  Minimal pain.  Doing well.

## 2018-04-19 NOTE — Op Note (Signed)
04/19/2018  4:55 PM  PATIENT:  Amanda [name]  62 y.o. female  PRE-OPERATIVE DIAGNOSIS:  Spondylolisthesis, Lumbar region, stenosis, herniated lumbar disc, radiculopathy, lumbago L 45 and L 5 S 1 levels  POST-OPERATIVE DIAGNOSIS:  Spondylolisthesis, Lumbar region, stenosis, herniated lumbar disc, radiculopathy, lumbago L 45 and L 5 S 1 levels  PROCEDURE:  Procedure(s) with comments: L4-5 L5-S1 Maximum access posterior lumbar interbody fusion (N/A) - L4-5 L5-S1 Maximum access posterior lumbar interbody fusion  SURGEON:  Surgeon(s) and Role:    Erline Levine, MD - Primary    Earnie Larsson, MD - Assisting  PHYSICIAN ASSISTANT:   ASSISTANTS: Poteat, RN   ANESTHESIA:   general  EBL:  175 mL   BLOOD ADMINISTERED:none  DRAINS: none   LOCAL MEDICATIONS USED:  MARCAINE    and LIDOCAINE   SPECIMEN:  No Specimen  DISPOSITION OF SPECIMEN:  N/A  COUNTS:  YES  TOURNIQUET:  * No tourniquets in log *  DICTATION: Patient is a 62 year old with spondylolisthesis , stenosis, disc herniation and severe back and bilateral lower extremity pain at L4/5 and L 5 S 1 levels of the lumbar spine.  It was elected to take him to surgery for MASPLIF at  L 45 and L 5 S 1  levels with posterolateral arthrodesis.  Procedure:   Following uncomplicated induction of GETA, and placement of electrodes for neural monitoring, patient was turned into a prone position on the [place] tableand using AP  fluoroscopy the area of planned incision was marked, prepped with betadine scrub and Duraprep, then draped. Exposure was performed of facet joint complex at L 45 and L 5 S 1 levels and the MAS retractor was placed.5.0 x 30 mm cortical Nuvasive screws were placed at L 4 bilaterally according to standard landmarks using neural monitoring.  A total laminectomy of L 4 and L 5 was then performed with disarticulation of facets.  Decompression was greater than for standard PLIF procedure and thorough decompression of the  thecal sac, bilateral L 4, L 5, S1 nerve roots was performed. Along with foraminal and extraforaminal portions of these nerve roots.  This bone was saved for grafting, combined with Osteocel after being run through bone mill and was placed in bone packing device.  Thorough discectomy was performed bilaterally at L 5 S 1  and the endplates were prepared for grafting.  23 x 8 x 12 degree cages were placed in the interspace and positioning was confirmed with AP and lateral fluoroscopy.  10 cc of autograft was packed in the interspace medial to the second cage.   Thorough discectomy was performed bilaterally at L 45  and the endplates were prepared for grafting.  23 x 8 x 8 degree cages were placed in the interspace and positioning was confirmed with AP and lateral fluoroscopy.  10 cc of autograft was packed in the interspace medial to the second cage.   Remaining screws were placed at L 5 and and S 1 (5.0 x 30 mm at each level on right and left) and 55 mm rod was placed on the right and 50 mm rod placed on the left. And the screws were locked and torqued.Final Xrays showed well positioned implants and screw fixation. The posterolateral region on the left was packed with remaining 20 cc of autograft/allograft on either side. The wounds were irrigated and then closed with 1, 2-0 and 3-0 Vicryl stitches. 20 cc long-acting Marcaine was injected into the musculature.  Sterile occlusive dressing was placed  with Dermabond and an occlusive dressing. The patient was then extubated in the operating room and taken to recovery in stable and satisfactory condition having tolerated her operation well. Counts were correct at the end of the case.  PLAN OF CARE: Admit to inpatient   PATIENT DISPOSITION:  PACU - hemodynamically stable.   Delay start of Pharmacological VTE agent (>24hrs) due to surgical blood loss or risk of bleeding: yes

## 2018-04-19 NOTE — Interval H&P Note (Signed)
History and Physical Interval Note:  04/19/2018 12:23 PM  Verble Bruns  has presented today for surgery, with the diagnosis of Spondylolisthesis, Lumbar region  The various methods of treatment have been discussed with the patient and family. After consideration of risks, benefits and other options for treatment, the patient has consented to  Procedure(s) with comments: L4-5 L5-S1 Maximum access posterior lumbar interbody fusion (N/A) - L4-5 L5-S1 Maximum access posterior lumbar interbody fusion as a surgical intervention .  The patient's history has been reviewed, patient examined, no change in status, stable for surgery.  I have reviewed the patient's chart and labs.  Questions were answered to the patient's satisfaction.     Amanda [name] D

## 2018-04-19 NOTE — Brief Op Note (Signed)
04/19/2018  4:55 PM  PATIENT:  Amanda [name]  62 y.o. female  PRE-OPERATIVE DIAGNOSIS:  Spondylolisthesis, Lumbar region, stenosis, herniated lumbar disc, radiculopathy, lumbago L 45 and L 5 S 1 levels  POST-OPERATIVE DIAGNOSIS:  Spondylolisthesis, Lumbar region, stenosis, herniated lumbar disc, radiculopathy, lumbago L 45 and L 5 S 1 levels  PROCEDURE:  Procedure(s) with comments: L4-5 L5-S1 Maximum access posterior lumbar interbody fusion (N/A) - L4-5 L5-S1 Maximum access posterior lumbar interbody fusion  SURGEON:  Surgeon(s) and Role:    Erline Levine, MD - Primary    Earnie Larsson, MD - Assisting  PHYSICIAN ASSISTANT:   ASSISTANTS: Poteat, RN   ANESTHESIA:   general  EBL:  175 mL   BLOOD ADMINISTERED:none  DRAINS: none   LOCAL MEDICATIONS USED:  MARCAINE    and LIDOCAINE   SPECIMEN:  No Specimen  DISPOSITION OF SPECIMEN:  N/A  COUNTS:  YES  TOURNIQUET:  * No tourniquets in log *  DICTATION: Patient is a 62 year old with spondylolisthesis , stenosis, disc herniation and severe back and bilateral lower extremity pain at L4/5 and L 5 S 1 levels of the lumbar spine.  It was elected to take him to surgery for MASPLIF at  L 45 and L 5 S 1  levels with posterolateral arthrodesis.  Procedure:   Following uncomplicated induction of GETA, and placement of electrodes for neural monitoring, patient was turned into a prone position on the [place] tableand using AP  fluoroscopy the area of planned incision was marked, prepped with betadine scrub and Duraprep, then draped. Exposure was performed of facet joint complex at L 45 and L 5 S 1 levels and the MAS retractor was placed.5.0 x 30 mm cortical Nuvasive screws were placed at L 4 bilaterally according to standard landmarks using neural monitoring.  A total laminectomy of L 4 and L 5 was then performed with disarticulation of facets.  Decompression was greater than for standard PLIF procedure and thorough decompression of the  thecal sac, bilateral L 4, L 5, S1 nerve roots was performed. Along with foraminal and extraforaminal portions of these nerve roots.  This bone was saved for grafting, combined with Osteocel after being run through bone mill and was placed in bone packing device.  Thorough discectomy was performed bilaterally at L 5 S 1  and the endplates were prepared for grafting.  23 x 8 x 12 degree cages were placed in the interspace and positioning was confirmed with AP and lateral fluoroscopy.  10 cc of autograft was packed in the interspace medial to the second cage.   Thorough discectomy was performed bilaterally at L 45  and the endplates were prepared for grafting.  23 x 8 x 8 degree cages were placed in the interspace and positioning was confirmed with AP and lateral fluoroscopy.  10 cc of autograft was packed in the interspace medial to the second cage.   Remaining screws were placed at L 5 and and S 1 (5.0 x 30 mm at each level on right and left) and 55 mm rod was placed on the right and 50 mm rod placed on the left. And the screws were locked and torqued.Final Xrays showed well positioned implants and screw fixation. The posterolateral region on the left was packed with remaining 20 cc of autograft/allograft on either side. The wounds were irrigated and then closed with 1, 2-0 and 3-0 Vicryl stitches. 20 cc long-acting Marcaine was injected into the musculature.  Sterile occlusive dressing was placed  with Dermabond and an occlusive dressing. The patient was then extubated in the operating room and taken to recovery in stable and satisfactory condition having tolerated her operation well. Counts were correct at the end of the case.  PLAN OF CARE: Admit to inpatient   PATIENT DISPOSITION:  PACU - hemodynamically stable.   Delay start of Pharmacological VTE agent (>24hrs) due to surgical blood loss or risk of bleeding: yes

## 2018-04-19 NOTE — H&P (Signed)
Patient ID:   [id number] Patient: Amanda [name]  Date of Birth: 1956-09-03 Visit Type: Office Visit   Date: 01/05/2018 10:00 AM Provider: Marchia Meiers. Vertell Limber MD   This 62 year old female presents for Review MRI.   History of Present Illness: 1.  Review MRI  Patient returns to review her MRI  On examination today the patient is left EHL strength at 4/5.  Plain radiographs reviewed which demonstrate mobile spondylolisthesis of L5 on S1 of 5 mm on neutral and 6 mm on extension, 5 mm on flexion and at L4-5 she has milder spondylolisthesis of 2 mm on extension, 2 mm neutral, 1 mm flexion.  She has marked left-sided greater than right-sided stenosis at both of these levels.   She is also complaining of headaches on the right side of her head and on my assessment I believe the patient has TMJ on the right   We discussed treatment options and given the stenosis and spondylolisthesis I have recommended that if the patient is to undergo surgery, which I think is appropriate given the severity of her pain complaints and the lack of improvement with nonsurgical options then I would recommend decompression and fusion at the L4-5 and L5-S1 levels.  This would be done via MAS PLIF technique.         Medical/Surgical/Interim History Reviewed, no change.  Last detailed document date:12/22/2017.     PAST MEDICAL HISTORY, SURGICAL HISTORY, FAMILY HISTORY, SOCIAL HISTORY AND REVIEW OF SYSTEMS I have reviewed the patient's past medical, surgical, family and social history as well as the comprehensive review of systems as included on the [facility] history form dated 11/15/2017, which I have signed.  Family History: Reviewed, no changes.  Last detailed document date:08/13/2014.   Social History: Reviewed, no changes. Last detailed document date: 11/15/2017.    MEDICATIONS(added, continued or stopped this visit): Started Medication Directions Instruction Stopped   11/24/2017 cyclobenzaprine 10 mg tablet TAKE 1 TABLET BY ORAL ROUTE UP TO TID PRN SPASM    01/04/2018 Mobic 7.5 mg tablet TAKE 2 TABLET BY ORAL ROUTE EVERY DAY    12/25/2016 naproxen 500 mg tablet TAKE 1 TABLET BY ORAL ROUTE 2 TIMES EVERY DAY WITH FOOD  01/07/2018  11/29/2017 Neurontin 600 mg tablet take 1 tablet by mouth twice daily     simvastatin 20 mg tablet take 1 tablet by oral route  every day in the evening    01/04/2018 Voltaren 1 % topical gel APPLY (2G) BY TOPICAL ROUTE 4 TIMES EVERY DAY TO THE AFFECTED AREA(S)       ALLERGIES: Ingredient Reaction Medication Name Comment  NO KNOWN ALLERGIES     No known allergies. Reviewed, no changes.    Vitals Date Temp F BP Pulse Ht In Wt Lb BMI BSA Pain Score  01/05/2018  118/74 88 61 148 27.96  8/10      IMPRESSION Patient has persistent numbness and weakness and significant degenerative stenosis with spondylolisthesis at L4-5 and L5-S1 levels   Completed Orders (this encounter) Order Details Reason Side Interpretation Result Initial Treatment Date Region  Dietary management education, guidance, and counseling patient encouraged to eat a well balanced diet         Assessment/Plan # Detail Type Description   1. Assessment Low back pain, unspecified back pain laterality, with sciatica presence unspecified (M54.5).       2. Assessment Idiopathic scoliosis of lumbar region (M41.26).       3. Assessment Degenerative lumbar spinal  stenosis (M48.061).       4. Assessment Radiculopathy, lumbar region (M54.16).       5. Assessment Spondylolisthesis, lumbar region (M43.16).   Plan Orders Aspen Lo Sag Rigid Panel Quick.       6. Assessment Body mass index (BMI) 27.0-27.9, adult (T77.11).   Plan Orders Today's instructions / counseling include(s) Dietary management education, guidance, and counseling.           Pain Management Plan Pain Scale: 8/10. Method: Numeric Pain Intensity Scale. Location: back. Onset:  07/18/2013. Duration: varies. Quality: discomforting. Pain management follow-up plan of care: Patient is taking OTC pain relievers for relief..  Proceed with a decompression and fusion L4-5 and L5-S1 levels .  Risks and benefits were discussed in detail with the patient and preoperative teaching was performed.  Patient was fitted for LSO brace.   Orders: Diagnostic Procedures: Assessment Procedure  M54.16 Lumbar Spine- AP/Lat  Instruction(s)/Education: Assessment Instruction  [phone] Dietary management education, guidance, and counseling  Miscellaneous: Assessment   M43.16 Aspen Lo Sag Rigid Panel Quick             Provider:  Vertell Limber MD, Marchia Meiers 01/09/2018 7:30 PM  Dictation edited by: Marchia Meiers. Vertell Limber    CC Providers: [facility] [address] [place],  [place]  [address]-   Kurt Lauenstein  [facility] [address] [place], [place] [address]-              Electronically signed by Marchia Meiers. Vertell Limber MD on 01/09/2018 07:30 PM

## 2018-04-19 NOTE — Transfer of Care (Signed)
Immediate Anesthesia Transfer of Care Note  Patient: Amanda [name]  Procedure(s) Performed: L4-5 L5-S1 Maximum access posterior lumbar interbody fusion (N/A Back)  Patient Location: PACU  Anesthesia Type:General  Level of Consciousness: awake, oriented and patient cooperative  Airway & Oxygen Therapy: Patient Spontanous Breathing and Patient connected to nasal cannula oxygen  Post-op Assessment: Report given to RN, Post -op Vital signs reviewed and stable and Patient moving all extremities  Post vital signs: Reviewed and stable  Last Vitals:  Vitals Value Taken Time  BP 96/75 04/19/2018  4:45 PM  Temp 36.5 C 04/19/2018  4:45 PM  Pulse 87 04/19/2018  4:45 PM  Resp 20 04/19/2018  4:45 PM  SpO2 100 % 04/19/2018  4:45 PM  Vitals shown include unvalidated device data.  Last Pain:  Vitals:   04/19/18 1142  TempSrc: Oral  PainSc:       Patients Stated Pain Goal: 0 (49/82/64 1583)  Complications: No apparent anesthesia complications

## 2018-04-19 NOTE — Anesthesia Preprocedure Evaluation (Addendum)
Anesthesia Evaluation  Patient identified by MRN, date of birth, ID band Patient awake    Reviewed: Allergy & Precautions, H&P , NPO status , Patient's Chart, lab work & pertinent test results  Airway Mallampati: II  TM Distance: >3 FB Neck ROM: Full    Dental no notable dental hx. (+) Teeth Intact, Dental Advisory Given   Pulmonary    breath sounds clear to auscultation       Cardiovascular negative cardio ROS   Rhythm:Regular Rate:Normal     Neuro/Psych    GI/Hepatic negative GI ROS, Neg liver ROS,   Endo/Other  Morbid obesity  Renal/GU negative Renal ROS     Musculoskeletal  (+) Arthritis ,   Abdominal (+) + obese,   Peds  Hematology   Anesthesia Other Findings   Reproductive/Obstetrics                            Anesthesia Physical Anesthesia Plan  ASA: II  Anesthesia Plan: General   Post-op Pain Management:    Induction:   PONV Risk Score and Plan: 4 or greater and Dexamethasone, Ondansetron and Treatment may vary due to age or medical condition  Airway Management Planned: Oral ETT  Additional Equipment:   Intra-op Plan:   Post-operative Plan: Extubation in OR  Informed Consent: I have reviewed the patients History and Physical, chart, labs and discussed the procedure including the risks, benefits and alternatives for the proposed anesthesia with the patient or authorized representative who has indicated his/her understanding and acceptance.   Dental advisory given  Plan Discussed with: CRNA  Anesthesia Plan Comments:         Anesthesia Quick Evaluation

## 2018-04-20 ENCOUNTER — Encounter (HOSPITAL_COMMUNITY): Payer: Self-pay | Admitting: Neurosurgery

## 2018-04-20 MED ORDER — BOOST / RESOURCE BREEZE PO LIQD CUSTOM
1.0000 | Freq: Three times a day (TID) | ORAL | Status: DC
  Administered 2018-04-20 – 2018-04-21 (×4): 1 via ORAL

## 2018-04-20 MED FILL — Sodium Chloride IV Soln 0.9%: INTRAVENOUS | Qty: 1000 | Status: AC

## 2018-04-20 MED FILL — Heparin Sodium (Porcine) Inj 1000 Unit/ML: INTRAMUSCULAR | Qty: 30 | Status: AC

## 2018-04-20 NOTE — Care Management Note (Signed)
Case Management Note  Patient Details  Name: Jannat Rosemeyer MRN: [id number] Date of Birth: Jan 15, 1956  Subjective/Objective:   62 yr old female L4-S1 PLIF on 04/19/18.                 Action/Plan: Case manager spoke with patient concerning discharge plan. Patient is under worker's comp, Her Nurse CM is [employer] with [place], Fax [phone]. This Probation officer faxed orders for [facility], OP note, PT/OT evaluation to [facility]. CM received authorization to request cane from [facility]. Patient will discharge home with family.    Expected Discharge Date:   04/21/18             Expected Discharge Plan:  [place]  In-House Referral:  NA  Discharge planning Services  CM Consult  Post Acute Care Choice:  Durable Medical Equipment, Home Health Choice offered to:  (worker Comp CM)  DME Arranged:  Kasandra Knudsen DME Agency:  [place]:  PT, OT, Nurse's Aide [place] Agency:  Other - See comment(to be arranged by [employer] Comp Case Freight forwarder)  Status of Service:  In process, will continue to follow  If discussed at Long Length of Stay Meetings, dates discussed:    Additional Comments:  Ninfa Meeker, RN 04/20/2018, 1:06 PM

## 2018-04-20 NOTE — Evaluation (Signed)
Occupational Therapy Evaluation Patient Details Name: Amanda [name] MRN: [id number] DOB: Nov 24, 1955 Today's Date: 04/20/2018    History of Present Illness Pt is a 62 y/o female who presents s/p L4-S1 PLIF on 04/19/18. PMH significant for Sickle cell trait, Colon CA, R shoulder arthroscopy, ACDF 2016.   Clinical Impression   PTA, pt was living alone and independent with ADL and functional mobility. She currently is limited by back pain and decreased activity tolerance for ADL participation. Pt currently requiring mod assist for LB ADL without AE to adhere to back precautions. Educated pt concerning use of sock aide and reacher and she is able to complete with min assist. Pt requiring min assist to power up to standing during toilet transfers and to sustain upright during functional mobility. She was educated concerning compensatory LB ADL strategies, brace wear schedule, need for clothing between brace and skin, as well as safe shower transfers. She requires mod assist for tub transfer at this time. Recommended that pt have family assist her 24 hours/day initially post-acute D/C. Additionally recommend home health OT services post-acute D/C. OT will continue to follow while admitted.     Follow Up Recommendations  Home health OT;Supervision/Assistance - 24 hour(initial 24 hour support)    Equipment Recommendations  Other (comment)(AE kit for LB ADL)    Recommendations for Other Services       Precautions / Restrictions Precautions Precautions: Fall;Back Precaution Booklet Issued: Yes (comment) Precaution Comments: Handout provided by PT. Pt able to state all precautions. Requires cues to adhere to these during ADL.  Required Braces or Orthoses: Spinal Brace Spinal Brace: Lumbar corset;Applied in sitting position Restrictions Weight Bearing Restrictions: No      Mobility Bed Mobility Overal bed mobility: Needs Assistance Bed Mobility: Sidelying to Sit;Rolling Rolling: Min  guard Sidelying to sit: Min guard       General bed mobility comments: Cues for all aspects of bed mobility with min guard assist for safety.   Transfers Overall transfer level: Needs assistance Equipment used: 1 person hand held assist Transfers: Sit to/from Stand Sit to Stand: Min assist         General transfer comment: Assist to power up with handheld assist. Pt reports limitation by pain.     Balance Overall balance assessment: Mild deficits observed, not formally tested                                         ADL either performed or assessed with clinical judgement   ADL Overall ADL's : Needs assistance/impaired Eating/Feeding: Set up;Sitting   Grooming: Min guard;Standing Grooming Details (indicate cue type and reason): educated concerning 2 cup method for oral care Upper Body Bathing: Supervision/ safety;Sitting   Lower Body Bathing: Moderate assistance;Sit to/from stand   Upper Body Dressing : Supervision/safety;Sitting Upper Body Dressing Details (indicate cue type and reason): educated concerning safe brace management Lower Body Dressing: Moderate assistance;Sit to/from stand;Minimal assistance;With adaptive equipment Lower Body Dressing Details (indicate cue type and reason): Mod assist without AE. Min assist with AE for donning socks and during power up to standing for management of pants.  Toilet Transfer: Ambulation;Minimal Insurance claims handler Details (indicate cue type and reason): assist to power up to standing position and handheld assist when ambulating Toileting- Clothing Manipulation and Hygiene: Minimal assistance;Sit to/from stand   Tub/ Banker: Moderate assistance;Ambulation;Shower seat;Tub transfer   Functional mobility during ADLs: Minimal  assistance General ADL Comments: Pt requiring handheld assist to maintain upright during functional mobility. Educated concerning compensatory ADL strategies for grooming,  bathing, toileting, and dressing tasks. Pt requiring cues to adhere to these.      Vision Patient Visual Report: No change from baseline Vision Assessment?: No apparent visual deficits     Perception     Praxis      Pertinent Vitals/Pain Pain Assessment: Faces Pain Score: 5  Faces Pain Scale: Hurts even more Pain Location: Incision site Pain Descriptors / Indicators: Operative site guarding Pain Intervention(s): Limited activity within patient's tolerance;Monitored during session;Repositioned;Patient requesting pain meds-RN notified     Hand Dominance Left   Extremity/Trunk Assessment Upper Extremity Assessment Upper Extremity Assessment: Overall WFL for tasks assessed   Lower Extremity Assessment Lower Extremity Assessment: Generalized weakness   Cervical / Trunk Assessment Cervical / Trunk Assessment: Other exceptions Cervical / Trunk Exceptions: s/p spinal surgery   Communication Communication Communication: No difficulties   Cognition Arousal/Alertness: Awake/alert Behavior During Therapy: WFL for tasks assessed/performed Overall Cognitive Status: Within Functional Limits for tasks assessed                                     General Comments  Pt stating "you guys really like to tell people what to do but you don't know what it feels like." OT expressed support and encouraged pt that the techniques to compensate that we went over today will help protect her spine and improve her ability to participate.     Exercises     Shoulder Instructions      Home Living Family/patient expects to be discharged to:: Private residence Living Arrangements: Alone Available Help at Discharge: Family;Friend(s);Available PRN/intermittently Type of Home: House Home Access: Level entry     Home Layout: One level     Bathroom Shower/Tub: Teacher, early years/pre: Standard     Home Equipment: Shower seat;Grab bars - tub/shower;Hand held shower  head;Adaptive equipment Adaptive Equipment: Reacher        Prior Functioning/Environment Level of Independence: Independent                 OT Problem List: Decreased strength;Decreased activity tolerance;Impaired balance (sitting and/or standing);Decreased knowledge of precautions;Decreased knowledge of use of DME or AE;Pain      OT Treatment/Interventions: Self-care/ADL training;Therapeutic exercise;Energy conservation;DME and/or AE instruction;Therapeutic activities;Patient/family education;Balance training    OT Goals(Current goals can be found in the care plan section) Acute Rehab OT Goals Patient Stated Goal: Home at d/c OT Goal Formulation: With patient Time For Goal Achievement: 05/04/18 Potential to Achieve Goals: Good ADL Goals Pt Will Perform Grooming: (P) with modified independence;standing Pt Will Perform Lower Body Bathing: (P) with modified independence;with adaptive equipment;sit to/from stand Pt Will Perform Lower Body Dressing: (P) with modified independence;with adaptive equipment;sit to/from stand Pt Will Transfer to Toilet: (P) with modified independence;ambulating;regular height toilet Pt Will Perform Toileting - Clothing Manipulation and hygiene: (P) with modified independence;sit to/from stand Pt Will Perform Tub/Shower Transfer: (P) Tub transfer;with modified independence;shower seat;ambulating;grab bars(cane)  OT Frequency: Min 2X/week   Barriers to D/C:            Co-evaluation              AM-PAC PT "6 Clicks" Daily Activity     Outcome Measure Help from another person eating meals?: None Help from another person taking care of personal grooming?:  A Little Help from another person toileting, which includes using toliet, bedpan, or urinal?: A Little Help from another person bathing (including washing, rinsing, drying)?: A Lot Help from another person to put on and taking off regular upper body clothing?: A Little Help from another person  to put on and taking off regular lower body clothing?: A Lot 6 Click Score: 17   End of Session Equipment Utilized During Treatment: Gait belt;Rolling walker Nurse Communication: Mobility status;Patient requests pain meds  Activity Tolerance: Patient tolerated treatment well Patient left: in chair;with call bell/phone within reach  OT Visit Diagnosis: Other abnormalities of gait and mobility (R26.89);Pain Pain - part of body: (back)                Time: 5277-8242 OT Time Calculation (min): 36 min Charges:  OT General Charges $OT Visit: 1 Visit OT Evaluation $OT Eval Low Complexity: 1 Low OT Treatments $Self Care/Home Management : 8-22 mins G-Codes:     Norman Herrlich, MS OTR/L  Pager: [place] A Kattleya Kuhnert 04/20/2018, 10:34 AM

## 2018-04-20 NOTE — Progress Notes (Addendum)
Subjective: Patient reports "I don't have any numbness anymore!"  Objective: Vital signs in last 24 hours: Temp:  [97.7 F (36.5 C)-99.6 F (37.6 C)] 99.6 F (37.6 C) (06/12 0708) Pulse Rate:  [79-115] 115 (06/12 0708) Resp:  [8-18] 16 (06/12 0708) BP: (96-148)/(48-82) 116/48 (06/12 0708) SpO2:  [99 %-100 %] 100 % (06/12 0708) Weight:  [65.3 kg (144 lb)] 65.3 kg (144 lb) (06/11 1142)  Intake/Output from previous day: 06/11 0701 - 06/12 0700 In: 1659.9 [I.V.:1559.9; IV Piggyback:100] Out: 1600 [Urine:1425; Blood:175] Intake/Output this shift: No intake/output data recorded.  Alert, conversant. Reports lumbar soreness, no leg pain or numbness. Good strength BLE. Incision without erythema, swelling or drainage beneath honeycomb and Dermabond.   Lab Results: No results for input(s): WBC, HGB, HCT, PLT in the last 72 hours. BMET No results for input(s): NA, K, CL, CO2, GLUCOSE, BUN, CREATININE, CALCIUM in the last 72 hours.  Studies/Results: Dg Lumbar Spine 2-3 Views  Result Date: 04/19/2018 CLINICAL DATA:  Lumbar fusion EXAM: DG C-ARM 61-120 MIN; LUMBAR SPINE - 2-3 VIEW COMPARISON:  Lumbar MRI 01/03/2018 FINDINGS: AP and lateral images of the lower lumbar spine were obtained with C-arm device in the operating room. Bilateral pedicle screw placement at L4, L5, and S1. Posterior rods have not yet been placed. Interbody spacers L4-5 and L5-S1. IMPRESSION: Lumbar fusion L4-5 and L5-S1. Electronically Signed   By: Franchot Gallo M.D.   On: 04/19/2018 16:29   Dg C-arm 1-60 Min  Result Date: 04/19/2018 CLINICAL DATA:  Lumbar fusion EXAM: DG C-ARM 61-120 MIN; LUMBAR SPINE - 2-3 VIEW COMPARISON:  Lumbar MRI 01/03/2018 FINDINGS: AP and lateral images of the lower lumbar spine were obtained with C-arm device in the operating room. Bilateral pedicle screw placement at L4, L5, and S1. Posterior rods have not yet been placed. Interbody spacers L4-5 and L5-S1. IMPRESSION: Lumbar fusion L4-5 and  L5-S1. Electronically Signed   By: Franchot Gallo M.D.   On: 04/19/2018 16:29   Dg C-arm 1-60 Min  Result Date: 04/19/2018 CLINICAL DATA:  Lumbar fusion EXAM: DG C-ARM 61-120 MIN; LUMBAR SPINE - 2-3 VIEW COMPARISON:  Lumbar MRI 01/03/2018 FINDINGS: AP and lateral images of the lower lumbar spine were obtained with C-arm device in the operating room. Bilateral pedicle screw placement at L4, L5, and S1. Posterior rods have not yet been placed. Interbody spacers L4-5 and L5-S1. IMPRESSION: Lumbar fusion L4-5 and L5-S1. Electronically Signed   By: Franchot Gallo M.D.   On: 04/19/2018 16:29    Assessment/Plan: Improving  LOS: 1 day  Continue to mobilize in brace.    Verdis Prime 04/20/2018, 7:47 AM  Patient is doing well.  Mobilize with PT today.

## 2018-04-20 NOTE — Evaluation (Signed)
Physical Therapy Evaluation Patient Details Name: Amanda [name] MRN: [id number] DOB: [birth year] Today's Date: 04/20/2018   History of Present Illness  Pt is a 62 y/o female who presents s/p L4-S1 PLIF on 04/19/18. PMH significant for Sickle cell trait, Colon CA, R shoulder arthroscopy, ACDF 2016.  Clinical Impression  Pt admitted with above diagnosis. Pt currently with functional limitations due to the deficits listed below (see PT Problem List). At the time of PT eval pt was able to perform transfers and ambulation with gross min guard assist for balance support and safety. Initially very guarded and reliant on railings in hall with overall poor posture. With addition of SPC and therapist support, pt was able to progress to upright posture and more fluid gait pattern. Pt will benefit from skilled PT to increase their independence and safety with mobility to allow discharge to the venue listed below.       Follow Up Recommendations Home health PT;Supervision for mobility/OOB    Equipment Recommendations  Cane    Recommendations for Other Services       Precautions / Restrictions Precautions Precautions: Fall;Back Precaution Booklet Issued: Yes (comment) Precaution Comments: Reviewed precautions in detail. Pt was cued for maintenance of precautions during functional mobility.  Required Braces or Orthoses: Spinal Brace Spinal Brace: Lumbar corset;Applied in sitting position Restrictions Weight Bearing Restrictions: No      Mobility  Bed Mobility               General bed mobility comments: Pt was received ambulating in the hall  Transfers Overall transfer level: Needs assistance Equipment used: Straight cane Transfers: Sit to/from Stand Sit to Stand: Min guard         General transfer comment: Close guard for safety as pt performed sit<>stand. VC's for hand placement on seated surface for safety.   Ambulation/Gait Ambulation/Gait assistance: Min guard Ambulation  Distance (Feet): 150 Feet Assistive device: Straight cane Gait Pattern/deviations: Step-through pattern;Decreased stride length;Trunk flexed;Narrow base of support Gait velocity: Decreased Gait velocity interpretation: <1.31 ft/sec, indicative of household ambulator General Gait Details: Slow and very guarded. Pt was able to ambulate in hall initially with heavy reliance on the railings. Gave SPC and pt was able to progress to ambulating without rail and moderate reliance on the cane.   Stairs            Wheelchair Mobility    Modified Rankin (Stroke Patients Only)       Balance Overall balance assessment: Mild deficits observed, not formally tested                                           Pertinent Vitals/Pain Pain Assessment: 0-10 Pain Score: 5  Pain Location: Incision site Pain Descriptors / Indicators: Operative site guarding Pain Intervention(s): Limited activity within patient's tolerance;Monitored during session;Repositioned    Home Living Family/patient expects to be discharged to:: Private residence Living Arrangements: Alone Available Help at Discharge: Family;Friend(s);Available PRN/intermittently Type of Home: House Home Access: Level entry     Home Layout: One level Home Equipment: Shower seat;Grab bars - tub/shower;Hand held shower head      Prior Function Level of Independence: Independent               Hand Dominance   Dominant Hand: Left    Extremity/Trunk Assessment   Upper Extremity Assessment Upper Extremity Assessment: Overall WFL for tasks assessed  Lower Extremity Assessment Lower Extremity Assessment: Generalized weakness    Cervical / Trunk Assessment Cervical / Trunk Assessment: Other exceptions Cervical / Trunk Exceptions: Forward head and rounded shoulder posture; s/p spinal surgery  Communication   Communication: No difficulties  Cognition Arousal/Alertness: Awake/alert Behavior During Therapy:  WFL for tasks assessed/performed Overall Cognitive Status: Within Functional Limits for tasks assessed                                        General Comments      Exercises     Assessment/Plan    PT Assessment Patient needs continued PT services  PT Problem List Decreased strength;Decreased range of motion;Decreased activity tolerance;Decreased balance;Decreased mobility;Decreased knowledge of use of DME;Decreased safety awareness;Decreased knowledge of precautions;Pain       PT Treatment Interventions DME instruction;Gait training;Stair training;Functional mobility training;Therapeutic activities;Therapeutic exercise;Neuromuscular re-education;Patient/family education    PT Goals (Current goals can be found in the Care Plan section)  Acute Rehab PT Goals Patient Stated Goal: Home at d/c PT Goal Formulation: With patient Time For Goal Achievement: 04/27/18 Potential to Achieve Goals: Good    Frequency Min 5X/week   Barriers to discharge Decreased caregiver support      Co-evaluation               AM-PAC PT "6 Clicks" Daily Activity  Outcome Measure Difficulty turning over in bed (including adjusting bedclothes, sheets and blankets)?: None Difficulty moving from lying on back to sitting on the side of the bed? : A Little Difficulty sitting down on and standing up from a chair with arms (e.g., wheelchair, bedside commode, etc,.)?: A Little Help needed moving to and from a bed to chair (including a wheelchair)?: A Little Help needed walking in hospital room?: A Little Help needed climbing 3-5 steps with a railing? : A Little 6 Click Score: 19    End of Session Equipment Utilized During Treatment: Gait belt;Back brace Activity Tolerance: Patient tolerated treatment well Patient left: with call bell/phone within reach(Sitting EOB for breakfast) Nurse Communication: Mobility status PT Visit Diagnosis: Unsteadiness on feet (R26.81);Pain;Other symptoms  and signs involving the nervous system (R29.898) Pain - part of body: (Back)    Time: 1761-6073 PT Time Calculation (min) (ACUTE ONLY): 18 min   Charges:   PT Evaluation $PT Eval Moderate Complexity: 1 Mod     PT G Codes:        Rolinda Roan, PT, DPT Acute Rehabilitation Services Pager: [phone]   Thelma Comp 04/20/2018, 7:57 AM

## 2018-04-21 MED ORDER — OXYCODONE HCL 5 MG PO TABS: 5.0000 mg | ORAL_TABLET | ORAL | 0 refills | Status: AC | PRN

## 2018-04-21 MED ORDER — ACETAMINOPHEN 500 MG PO TABS: 1000.0000 mg | ORAL_TABLET | Freq: Four times a day (QID) | ORAL | Status: DC | PRN

## 2018-04-21 MED ORDER — METHOCARBAMOL 500 MG PO TABS: 500.0000 mg | ORAL_TABLET | Freq: Four times a day (QID) | ORAL | 1 refills | Status: DC | PRN

## 2018-04-21 NOTE — Discharge Instructions (Signed)

## 2018-04-21 NOTE — Progress Notes (Addendum)
Subjective: Patient reports "I'm going to run in a minute!' laughing.  Objective: Vital signs in last 24 hours: Temp:  [98.9 F (37.2 C)-103 F (39.4 C)] 99.8 F (37.7 C) (06/13 0753) Pulse Rate:  [112-125] 114 (06/13 0750) Resp:  [16-18] 18 (06/13 0750) BP: (106-131)/(54-89) 115/54 (06/13 0750) SpO2:  [96 %-99 %] 99 % (06/13 0750)  Intake/Output from previous day: No intake/output data recorded. Intake/Output this shift: No intake/output data recorded.  Standing, working with Therapy. Reports lumbar soreness, reporting no recurrence of leg symptoms. Good strength BLE. Incision without erythema, swelling, or drainage beneath honeycomb and Dermabond.   Lab Results: No results for input(s): WBC, HGB, HCT, PLT in the last 72 hours. BMET No results for input(s): NA, K, CL, CO2, GLUCOSE, BUN, CREATININE, CALCIUM in the last 72 hours.  Studies/Results: Dg Lumbar Spine 2-3 Views  Result Date: 04/19/2018 CLINICAL DATA:  Lumbar fusion EXAM: DG C-ARM 61-120 MIN; LUMBAR SPINE - 2-3 VIEW COMPARISON:  Lumbar MRI 01/03/2018 FINDINGS: AP and lateral images of the lower lumbar spine were obtained with C-arm device in the operating room. Bilateral pedicle screw placement at L4, L5, and S1. Posterior rods have not yet been placed. Interbody spacers L4-5 and L5-S1. IMPRESSION: Lumbar fusion L4-5 and L5-S1. Electronically Signed   By: Franchot Gallo M.D.   On: 04/19/2018 16:29   Dg C-arm 1-60 Min  Result Date: 04/19/2018 CLINICAL DATA:  Lumbar fusion EXAM: DG C-ARM 61-120 MIN; LUMBAR SPINE - 2-3 VIEW COMPARISON:  Lumbar MRI 01/03/2018 FINDINGS: AP and lateral images of the lower lumbar spine were obtained with C-arm device in the operating room. Bilateral pedicle screw placement at L4, L5, and S1. Posterior rods have not yet been placed. Interbody spacers L4-5 and L5-S1. IMPRESSION: Lumbar fusion L4-5 and L5-S1. Electronically Signed   By: Franchot Gallo M.D.   On: 04/19/2018 16:29   Dg C-arm 1-60  Min  Result Date: 04/19/2018 CLINICAL DATA:  Lumbar fusion EXAM: DG C-ARM 61-120 MIN; LUMBAR SPINE - 2-3 VIEW COMPARISON:  Lumbar MRI 01/03/2018 FINDINGS: AP and lateral images of the lower lumbar spine were obtained with C-arm device in the operating room. Bilateral pedicle screw placement at L4, L5, and S1. Posterior rods have not yet been placed. Interbody spacers L4-5 and L5-S1. IMPRESSION: Lumbar fusion L4-5 and L5-S1. Electronically Signed   By: Franchot Gallo M.D.   On: 04/19/2018 16:29    Assessment/Plan: Improving  LOS: 2 days  Per Dr. Vertell Limber, d/c to home with HHPT/OT/Aide. Office f/u in 3-4 weeks with x-rays. Rx's for home will be eRx'ed from office. (Robaxin 500mg  q6-8hrs prn spasm & Oxycodone 5mg  1-2 po q4-6hrs prn pain)   [place], Aaron Edelman 04/21/2018, 10:30 AM   Patient is doing well.  Discharge home.

## 2018-04-21 NOTE — Progress Notes (Signed)
Occupational Therapy Treatment Patient Details Name: Amanda [name] MRN: [id number] DOB: [birth year] Today's Date: 04/21/2018    History of present illness Pt is a 62 y/o female who presents s/p L4-S1 PLIF on 04/19/18. PMH significant for Sickle cell trait, Colon CA, R shoulder arthroscopy, ACDF 2016.   OT comments  Pt making good progress. Completed education regarding ADL and functional mobility management, use of DME and AE to maximize independence and adherence to back precautions. Pt safe to DC when medically stable. Recommend follow up with [facility].   Follow Up Recommendations  Home health OT;Supervision - Intermittent    Equipment Recommendations  Other (comment)(AE for LB ADL)    Recommendations for Other Services      Precautions / Restrictions Precautions Precautions: Back Precaution Booklet Issued: Yes (comment) Required Braces or Orthoses: Spinal Brace Spinal Brace: Lumbar corset;Applied in sitting position       Mobility Bed Mobility Overal bed mobility: Modified Independent                Transfers Overall transfer level: Modified independent                    Balance Overall balance assessment: Mild deficits observed, not formally tested(using cane)                                         ADL either performed or assessed with clinical judgement   ADL Overall ADL's : Needs assistance/impaired                         Toilet Transfer: Supervision/safety;Ambulation;Comfort height toilet(cane)   Toileting- Clothing Manipulation and Hygiene: Supervision/safety;Set up [place] Details (indicate cue type and reason): Educated on use of AE if needed for hygiene after toileting     Functional mobility during ADLs: Supervision/safety General ADL Comments: Able to donn brace with set up. Educated on compensatory techqniques and available to AE to assist with ADL. Pt unable to cross feet overknees at  this time and may need to use AE short term. Educated on adapting environment to increase independence with IADL tasks. Pt verbalized understanding. Educated on reducing risk of falls.      Vision       Perception     Praxis      Cognition Arousal/Alertness: Awake/alert Behavior During Therapy: WFL for tasks assessed/performed Overall Cognitive Status: Within Functional Limits for tasks assessed                                          Exercises     Shoulder Instructions       General Comments      Pertinent Vitals/ Pain       Pain Assessment: 0-10 Pain Score: 7  Pain Location: back Pain Descriptors / Indicators: Operative site guarding Pain Intervention(s): Limited activity within patient's tolerance  Home Living                                          Prior Functioning/Environment              Frequency  Min 2X/week  Progress Toward Goals  OT Goals(current goals can now be found in the care plan section)  Progress towards OT goals: Progressing toward goals  Acute Rehab OT Goals Patient Stated Goal: Home at d/c OT Goal Formulation: With patient Time For Goal Achievement: 05/04/18 Potential to Achieve Goals: Good ADL Goals Pt Will Perform Grooming: with modified independence;standing Pt Will Perform Lower Body Bathing: with modified independence;with adaptive equipment;sit to/from stand Pt Will Perform Lower Body Dressing: with modified independence;with adaptive equipment;sit to/from stand Pt Will Transfer to Toilet: with modified independence;ambulating;regular height toilet Pt Will Perform Toileting - Clothing Manipulation and hygiene: with modified independence;sit to/from stand Pt Will Perform Tub/Shower Transfer: Tub transfer;with modified independence;shower seat;ambulating;grab bars  Plan Discharge plan remains appropriate    Co-evaluation                 AM-PAC PT "6 Clicks" Daily Activity      Outcome Measure   Help from another person eating meals?: None Help from another person taking care of personal grooming?: None Help from another person toileting, which includes using toliet, bedpan, or urinal?: A Little Help from another person bathing (including washing, rinsing, drying)?: A Little Help from another person to put on and taking off regular upper body clothing?: A Little Help from another person to put on and taking off regular lower body clothing?: A Little 6 Click Score: 20    End of Session Equipment Utilized During Treatment: Gait belt;Back brace(cane)  OT Visit Diagnosis: Other abnormalities of gait and mobility (R26.89);Pain Pain - part of body: (back)   Activity Tolerance Patient tolerated treatment well   Patient Left in bed;with call bell/phone within reach   Nurse Communication Mobility status        Time: 9983-3825 OT Time Calculation (min): 28 min  Charges: OT General Charges $OT Visit: 1 Visit OT Treatments $Self Care/Home Management : 23-37 mins  Maurie Boettcher, OT/L  OT Clinical Specialist [phone]    [facility] 04/21/2018, 10:56 AM

## 2018-04-21 NOTE — Discharge Summary (Signed)
Physician Discharge Summary  Patient ID: Amanda [name] MRN: [id number] DOB/AGE: [birth year] 62 y.o.  Admit date: 04/19/2018 Discharge date: 04/21/2018  Admission Diagnoses:Spondylolisthesis, Lumbar region, stenosis, herniated lumbar disc, radiculopathy, lumbago L 45 and L 5 S 1 levels    Discharge Diagnoses: Spondylolisthesis, Lumbar region, stenosis, herniated lumbar disc, radiculopathy, lumbago L 45 and L 5 S 1 levels s/p L4-5 L5-S1 Maximum access posterior lumbar interbody fusion (N/A) - L4-5 L5-S1 Maximum access posterior lumbar interbody fusion     Active Problems:   Spondylolisthesis of lumbar region   Discharged Condition: good  Hospital Course: Amanda [name] was admitted for surgery with dx spondylolisthesis, radiculopathy. Following uncomplicated MAS PLIF N8-2, L5-S1, she recovered nicely and transferred to [facility] for nursing care and therapies. She has mobilized well.  Consults: None  Significant Diagnostic Studies: radiology: X-Ray: intra-op  Treatments: surgery: L4-5 L5-S1 Maximum access posterior lumbar interbody fusion (N/A) - L4-5 L5-S1 Maximum access posterior lumbar interbody fusion    Discharge Exam: Blood pressure (!) 115/54, pulse (!) 114, temperature 99.8 F (37.7 C), temperature source Oral, resp. rate 18, height 5\' 1"  (1.549 m), weight 65.3 kg (144 lb), SpO2 99 %. Standing, working with Therapy. Reports lumbar soreness, reporting no recurrence of leg symptoms. Good strength BLE. Incision without erythema, swelling, or drainage beneath honeycomb and Dermabond.    Disposition: Discharge disposition: 01-Home or Self Care  with HHPT/OT/Aide. Office f/u in 3-4 weeks with x-rays. Rx's for home will be eRx'ed from office.(Robaxin 500mg  q6-8hrs prn spasm & Oxycodone 5mg  1-2 po q4-6hrs prn pain)         Allergies as of 04/21/2018      Reactions   Vicodin [hydrocodone-acetaminophen] Nausea And Vomiting      Medication List     STOP taking these medications   meloxicam 7.5 MG tablet Commonly known as:  MOBIC   naproxen 500 MG tablet Commonly known as:  NAPROSYN     TAKE these medications   acetaminophen 500 MG tablet Commonly known as:  TYLENOL Take 1,000 mg by mouth every 6 (six) hours as needed for mild pain or moderate pain.   B-12 2500 MCG Tabs Take 2,500 mcg by mouth daily.   Biotin 5000 MCG Tabs Take 5,000 mcg by mouth daily.   Black Cohosh 40 MG Caps Take 40 mg by mouth daily. My take a second 40mg  dose as needed for hot flashes   cetirizine 10 MG tablet Commonly known as:  ZYRTEC Take 10 mg by mouth daily as needed for allergies.   cholecalciferol 1000 units tablet Commonly known as:  VITAMIN D Take 1,000 Units by mouth daily.   cyclobenzaprine 10 MG tablet Commonly known as:  FLEXERIL Take 1 tablet (10 mg total) by mouth 3 (three) times daily as needed for muscle spasms.   diclofenac sodium 1 % Gel Commonly known as:  VOLTAREN Apply 1 application topically 4 (four) times daily as needed (pain).   diphenhydrAMINE 25 mg capsule Commonly known as:  BENADRYL Take 25 mg by mouth daily as needed for allergies.   EYE VITAMINS & MINERALS Tabs Take 1 tablet by mouth daily.   folic acid 956 MCG tablet Commonly known as:  FOLVITE Take 400 mcg by mouth daily.   gabapentin 600 MG tablet Commonly known as:  NEURONTIN Take 600 mg by mouth 2 (two) times daily.   methocarbamol 500 MG tablet Commonly known as:  ROBAXIN Take 1 tablet (500 mg total) by mouth every 6 (six) hours as needed for muscle  spasms.   neomycin-polymyxin-hydrocortisone 3.5-10000-1 OTIC suspension Commonly known as:  CORTISPORIN Place 4 drops into the right ear daily as needed (headaches/pain).   oxyCODONE 5 MG immediate release tablet Commonly known as:  Oxy IR/ROXICODONE Take 1-2 tablets (5-10 mg total) by mouth every 4 (four) hours as needed for moderate pain ((score 4 to 6)).   Red Yeast Rice 600 MG  Tabs Take 600 mg by mouth daily.   simvastatin 20 MG tablet Commonly known as:  ZOCOR Take 20 mg by mouth every evening.   Turmeric 500 MG Tabs Take 500 mg by mouth daily.   vitamin E 400 UNIT capsule Take 400 Units by mouth daily.            Durable Medical Equipment  (From admission, onward)        Start     Ordered   04/20/18 0848  For home use only DME Cane  Once     04/20/18 0847       Signed: Peggyann Shoals, MD 04/21/2018, 10:54 AM

## 2018-04-21 NOTE — Progress Notes (Signed)
Patient alert and oriented, mae's well, voiding adequate amount of urine, swallowing without difficulty, no c/o pain at time of discharge. Patient discharged home with family. Script and discharged instructions given to patient. Patient and family stated understanding of instructions given. Patient has an appointment with Dr.Stern    

## 2018-04-21 NOTE — Progress Notes (Signed)
Physical Therapy Treatment Patient Details Name: Amanda [name] MRN: [id number] DOB: 01-05-1956 Today's Date: 04/21/2018    History of Present Illness Pt is a 62 y/o female who presents s/p L4-S1 PLIF on 04/19/18. PMH significant for Sickle cell trait, Colon CA, R shoulder arthroscopy, ACDF 2016.    PT Comments    Pt progressing towards physical therapy goals. Was mobilizing fairly well with SPC this session, although excessively slow. Pt did not seem interested in education or feedback from therapist, however has a precaution sheet present in room to reference if needed. Will continue to follow and progress as able per POC.    Follow Up Recommendations  Home health PT;Supervision for mobility/OOB     Equipment Recommendations  Cane    Recommendations for Other Services       Precautions / Restrictions Precautions Precautions: Back Precaution Booklet Issued: Yes (comment) Precaution Comments: Handout provided by PT. Pt able to state all precautions. Requires cues to adhere to these during ADL.  Required Braces or Orthoses: Spinal Brace Spinal Brace: Lumbar corset;Applied in sitting position Restrictions Weight Bearing Restrictions: No    Mobility  Bed Mobility Overal bed mobility: Modified Independent             General bed mobility comments: Pt was received exiting bathroom  Transfers Overall transfer level: Modified independent Equipment used: Straight cane Transfers: Sit to/from Stand           General transfer comment: Slow but able to power-up tp full stand without assist.   Ambulation/Gait Ambulation/Gait assistance: Supervision Gait Distance (Feet): 150 Feet Assistive device: Straight cane Gait Pattern/deviations: Step-through pattern;Decreased stride length;Trunk flexed;Narrow base of support Gait velocity: Decreased Gait velocity interpretation: <1.31 ft/sec, indicative of household ambulator General Gait Details: Very slow and guarded but  generally steady. Pt refused to attempt increasing gait speed even after explaining she could be increasing her muscular demand and pain by moving excessively slow.    Stairs             Wheelchair Mobility    Modified Rankin (Stroke Patients Only)       Balance Overall balance assessment: Mild deficits observed, not formally tested(using cane)                                          Cognition Arousal/Alertness: Awake/alert Behavior During Therapy: WFL for tasks assessed/performed Overall Cognitive Status: Within Functional Limits for tasks assessed                                        Exercises      General Comments General comments (skin integrity, edema, etc.): Pt generally not willing to accept education or feedback from therapist.       Pertinent Vitals/Pain Pain Assessment: Faces Pain Score: 7  Faces Pain Scale: Hurts even more Pain Location: back Pain Descriptors / Indicators: Operative site guarding Pain Intervention(s): Monitored during session    Home Living                      Prior Function            PT Goals (current goals can now be found in the care plan section) Acute Rehab PT Goals Patient Stated Goal: Home at d/c PT Goal Formulation: With  patient Time For Goal Achievement: 04/27/18 Potential to Achieve Goals: Good Progress towards PT goals: Progressing toward goals    Frequency    Min 5X/week      PT Plan Current plan remains appropriate    Co-evaluation              AM-PAC PT "6 Clicks" Daily Activity  Outcome Measure  Difficulty turning over in bed (including adjusting bedclothes, sheets and blankets)?: None Difficulty moving from lying on back to sitting on the side of the bed? : A Little Difficulty sitting down on and standing up from a chair with arms (e.g., wheelchair, bedside commode, etc,.)?: A Little Help needed moving to and from a bed to chair (including a  wheelchair)?: A Little Help needed walking in hospital room?: A Little Help needed climbing 3-5 steps with a railing? : A Little 6 Click Score: 19    End of Session Equipment Utilized During Treatment: Gait belt;Back brace Activity Tolerance: Patient tolerated treatment well Patient left: with call bell/phone within reach(Sitting EOB for breakfast) Nurse Communication: Mobility status PT Visit Diagnosis: Unsteadiness on feet (R26.81);Pain;Other symptoms and signs involving the nervous system (R29.898) Pain - part of body: (Back)     Time: 3888-7579 PT Time Calculation (min) (ACUTE ONLY): 19 min  Charges:  $Gait Training: 8-22 mins                    G Codes:       Rolinda Roan, PT, DPT Acute Rehabilitation Services Pager: [place] 04/21/2018, 11:55 AM

## 2018-05-05 ENCOUNTER — Encounter (HOSPITAL_COMMUNITY): Payer: Self-pay | Admitting: Neurosurgery

## 2018-05-06 ENCOUNTER — Encounter (HOSPITAL_COMMUNITY): Payer: Self-pay | Admitting: Neurosurgery

## 2018-06-16 ENCOUNTER — Other Ambulatory Visit: Payer: Self-pay | Admitting: Oncology

## 2018-06-16 DIAGNOSIS — Z1231 Encounter for screening mammogram for malignant neoplasm of breast: Secondary | ICD-10-CM

## 2018-06-29 DIAGNOSIS — R69 Illness, unspecified: Secondary | ICD-10-CM | POA: Diagnosis not present

## 2018-07-05 DIAGNOSIS — E782 Mixed hyperlipidemia: Secondary | ICD-10-CM | POA: Diagnosis not present

## 2018-07-05 DIAGNOSIS — N760 Acute vaginitis: Secondary | ICD-10-CM | POA: Diagnosis not present

## 2018-07-05 DIAGNOSIS — K5901 Slow transit constipation: Secondary | ICD-10-CM | POA: Diagnosis not present

## 2018-07-05 DIAGNOSIS — M542 Cervicalgia: Secondary | ICD-10-CM | POA: Diagnosis not present

## 2018-07-05 DIAGNOSIS — N951 Menopausal and female climacteric states: Secondary | ICD-10-CM | POA: Diagnosis not present

## 2018-08-13 DIAGNOSIS — R69 Illness, unspecified: Secondary | ICD-10-CM | POA: Diagnosis not present

## 2018-09-22 DIAGNOSIS — Z01419 Encounter for gynecological examination (general) (routine) without abnormal findings: Secondary | ICD-10-CM | POA: Diagnosis not present

## 2018-09-22 DIAGNOSIS — Z13220 Encounter for screening for lipoid disorders: Secondary | ICD-10-CM | POA: Diagnosis not present

## 2018-09-22 DIAGNOSIS — Z0000 Encounter for general adult medical examination without abnormal findings: Secondary | ICD-10-CM | POA: Diagnosis not present

## 2018-09-22 DIAGNOSIS — E782 Mixed hyperlipidemia: Secondary | ICD-10-CM | POA: Diagnosis not present

## 2018-10-19 DIAGNOSIS — R69 Illness, unspecified: Secondary | ICD-10-CM | POA: Diagnosis not present

## 2018-10-28 ENCOUNTER — Ambulatory Visit (INDEPENDENT_AMBULATORY_CARE_PROVIDER_SITE_OTHER): Payer: Medicare HMO

## 2018-10-28 ENCOUNTER — Other Ambulatory Visit: Payer: Self-pay | Admitting: Podiatry

## 2018-10-28 ENCOUNTER — Encounter: Payer: Self-pay | Admitting: Podiatry

## 2018-10-28 ENCOUNTER — Ambulatory Visit: Payer: Medicare HMO | Admitting: Podiatry

## 2018-10-28 DIAGNOSIS — M79671 Pain in right foot: Secondary | ICD-10-CM

## 2018-10-28 DIAGNOSIS — M2011 Hallux valgus (acquired), right foot: Secondary | ICD-10-CM

## 2018-10-28 DIAGNOSIS — M21619 Bunion of unspecified foot: Secondary | ICD-10-CM | POA: Diagnosis not present

## 2018-10-28 DIAGNOSIS — M2012 Hallux valgus (acquired), left foot: Secondary | ICD-10-CM | POA: Diagnosis not present

## 2018-10-28 DIAGNOSIS — M79672 Pain in left foot: Secondary | ICD-10-CM | POA: Diagnosis not present

## 2018-10-28 DIAGNOSIS — B351 Tinea unguium: Secondary | ICD-10-CM

## 2018-10-28 NOTE — Progress Notes (Signed)
DG  °

## 2018-10-28 NOTE — Patient Instructions (Signed)
Bunion  A bunion is a bump on the base of the big toe that forms when the bones of the big toe joint move out of position. Bunions may be small at first, but they often get larger over time. They can make walking painful. What are the causes? A bunion may be caused by:  Wearing narrow or pointed shoes that force the big toe to press against the other toes.  Abnormal foot development that causes the foot to roll inward (pronate).  Changes in the foot that are caused by certain diseases, such as rheumatoid arthritis or polio.  A foot injury. What increases the risk? The following factors may make you more likely to develop this condition:  Wearing shoes that squeeze the toes together.  Having certain diseases, such as: ? Rheumatoid arthritis. ? Polio. ? Cerebral palsy.  Having family members who have bunions.  Being born with a foot deformity, such as flat feet or low arches.  Doing activities that put a lot of pressure on the feet, such as ballet dancing. What are the signs or symptoms? The main symptom of a bunion is a noticeable bump on the big toe. Other symptoms may include:  Pain.  Swelling around the big toe.  Redness and inflammation.  Thick or hardened skin on the big toe or between the toes.  Stiffness or loss of motion in the big toe.  Trouble with walking. How is this diagnosed? A bunion may be diagnosed based on your symptoms, medical history, and activities. You may have tests, such as:  X-rays. These allow your health care provider to check the position of the bones in your foot and look for damage to your joint. They also help your health care provider determine the severity of your bunion and the best way to treat it.  Joint aspiration. In this test, a sample of fluid is removed from the toe joint. This test may be done if you are in a lot of pain. It helps rule out diseases that cause painful swelling of the joints, such as arthritis. How is this  treated? Treatment depends on the severity of your symptoms. The goal of treatment is to relieve symptoms and prevent the bunion from getting worse. Your health care provider may recommend:  Wearing shoes that have a wide toe box.  Using bunion pads to cushion the affected area.  Taping your toes together to keep them in a normal position.  Placing a device inside your shoe (orthotics) to help reduce pressure on your toe joint.  Taking medicine to ease pain, inflammation, and swelling.  Applying heat or ice to the affected area.  Doing stretching exercises.  Surgery to remove scar tissue and move the toes back into their normal position. This treatment is rare. Follow these instructions at home: Managing pain, stiffness, and swelling   If directed, put ice on the painful area: ? Put ice in a plastic bag. ? Place a towel between your skin and the bag. ? Leave the ice on for 20 minutes, 2-3 times a day. Activity   If directed, apply heat to the affected area before you exercise. Use the heat source that your health care provider recommends, such as a moist heat pack or a heating pad. ? Place a towel between your skin and the heat source. ? Leave the heat on for 20-30 minutes. ? Remove the heat if your skin turns bright red. This is especially important if you are unable to feel pain,   heat, or cold. You may have a greater risk of getting burned.  Do exercises as told by your health care provider. General instructions  Support your toe joint with proper footwear, shoe padding, or taping as told by your health care provider.  Take over-the-counter and prescription medicines only as told by your health care provider.  Keep all follow-up visits as told by your health care provider. This is important. Contact a health care provider if your symptoms:  Get worse.  Do not improve in 2 weeks. Get help right away if you have:  Severe pain and trouble with walking. Summary  A  bunion is a bump on the base of the big toe that forms when the bones of the big toe joint move out of position.  Bunions can make walking painful.  Treatment depends on the severity of your symptoms.  Support your toe joint with proper footwear, shoe padding, or taping as told by your health care provider. This information is not intended to replace advice given to you by your health care provider. Make sure you discuss any questions you have with your health care provider. Document Released: 10/26/2005 Document Revised: 03/08/2018 Document Reviewed: 03/08/2018 Elsevier Interactive Patient Education  2019 [employer]  

## 2018-10-29 NOTE — Progress Notes (Signed)
Subjective:   Patient ID: Amanda [name], female   DOB: 62 y.o.   MRN: [id number]   HPI Patient presents concerned about moderate structural bunion deformity nail fungus right and states that while she is not getting pain she wanted to get this evaluated and see if anything is going to be necessary for her.  Patient does not smoke and likes to be active   Review of Systems  All other systems reviewed and are negative.       Objective:  Physical Exam Vitals signs and nursing note reviewed.  Constitutional:      Appearance: She is well-developed.  Pulmonary:     Effort: Pulmonary effort is normal.  Musculoskeletal: Normal range of motion.  Skin:    General: Skin is warm.  Neurological:     Mental Status: She is alert.     Neurovascular status intact muscle strength is adequate with patient found to have moderate structural bunion deformity bilateral with moderate deviation of the hallux against second toe bilateral with slight discoloration hallux second nail right foot.  Patient has good digital perfusion is well oriented x3     Assessment:  Mild chronic lesion formation with structural bunion deformity of a moderate nature bilateral     Plan:  H&P condition reviewed reviewed x-rays and at this point do not recommend surgery but no that may be necessary at one point in the future.  Patient will wear wider shoes softer leather and I do not recommend treatment for some slight discoloration of nailbeds right which is normal and the aging process  X-rays were positive for moderate structural bunion deformity left over right with what what appears to be cyst formation but no other pathology is noted

## 2018-10-31 ENCOUNTER — Ambulatory Visit
Admission: RE | Admit: 2018-10-31 | Discharge: 2018-10-31 | Disposition: A | Discharge status: Home / Self Care | Payer: Medicare HMO | Source: Ambulatory Visit | Attending: Oncology | Admitting: Oncology

## 2018-10-31 DIAGNOSIS — Z1231 Encounter for screening mammogram for malignant neoplasm of breast: Secondary | ICD-10-CM

## 2018-11-03 DIAGNOSIS — L658 Other specified nonscarring hair loss: Secondary | ICD-10-CM | POA: Diagnosis not present

## 2018-11-03 DIAGNOSIS — L728 Other follicular cysts of the skin and subcutaneous tissue: Secondary | ICD-10-CM | POA: Diagnosis not present

## 2018-11-30 DIAGNOSIS — G629 Polyneuropathy, unspecified: Secondary | ICD-10-CM | POA: Diagnosis not present

## 2018-11-30 DIAGNOSIS — G8929 Other chronic pain: Secondary | ICD-10-CM | POA: Diagnosis not present

## 2018-11-30 DIAGNOSIS — Z823 Family history of stroke: Secondary | ICD-10-CM | POA: Diagnosis not present

## 2018-11-30 DIAGNOSIS — E785 Hyperlipidemia, unspecified: Secondary | ICD-10-CM | POA: Diagnosis not present

## 2018-11-30 DIAGNOSIS — Z8249 Family history of ischemic heart disease and other diseases of the circulatory system: Secondary | ICD-10-CM | POA: Diagnosis not present

## 2018-11-30 DIAGNOSIS — Z85038 Personal history of other malignant neoplasm of large intestine: Secondary | ICD-10-CM | POA: Diagnosis not present

## 2018-11-30 DIAGNOSIS — Z833 Family history of diabetes mellitus: Secondary | ICD-10-CM | POA: Diagnosis not present

## 2018-11-30 DIAGNOSIS — Z791 Long term (current) use of non-steroidal anti-inflammatories (NSAID): Secondary | ICD-10-CM | POA: Diagnosis not present

## 2018-12-22 DIAGNOSIS — K5901 Slow transit constipation: Secondary | ICD-10-CM | POA: Diagnosis not present

## 2018-12-22 DIAGNOSIS — N951 Menopausal and female climacteric states: Secondary | ICD-10-CM | POA: Diagnosis not present

## 2018-12-22 DIAGNOSIS — R252 Cramp and spasm: Secondary | ICD-10-CM | POA: Diagnosis not present

## 2019-01-02 ENCOUNTER — Other Ambulatory Visit: Payer: Self-pay | Admitting: Oncology

## 2019-01-02 DIAGNOSIS — Z1231 Encounter for screening mammogram for malignant neoplasm of breast: Secondary | ICD-10-CM

## 2019-01-02 IMAGING — MG DIGITAL SCREENING BILATERAL MAMMOGRAM WITH TOMO AND CAD
8 series · 8 of 24 positions shown · non-contrast
Comparison: Previous exam(s).

CLINICAL DATA: Screening.

EXAM:
DIGITAL SCREENING BILATERAL MAMMOGRAM WITH TOMO AND CAD

[R CC synth-2D]
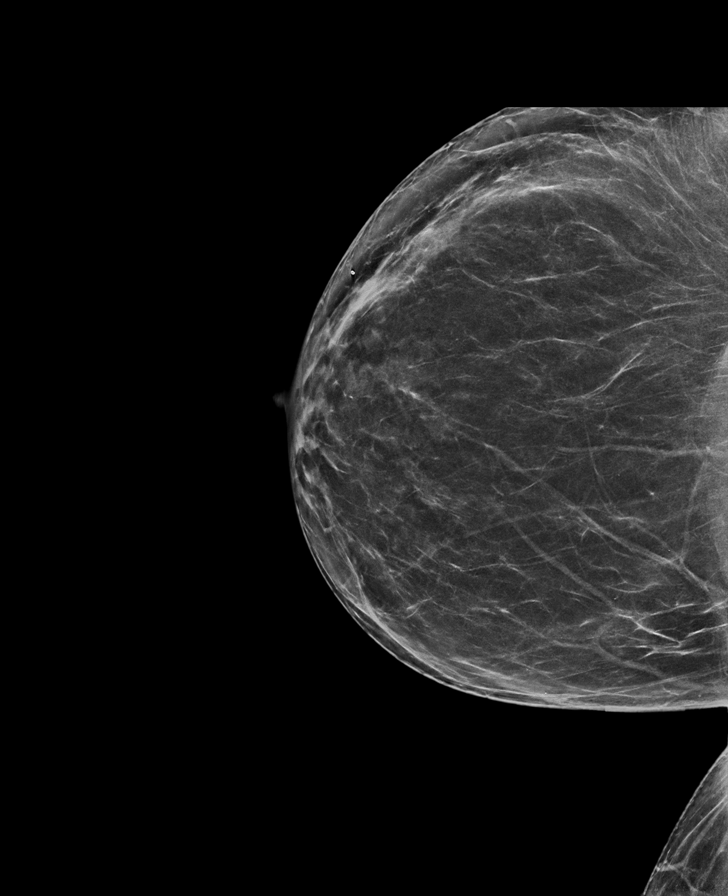

[R MLO synth-2D]
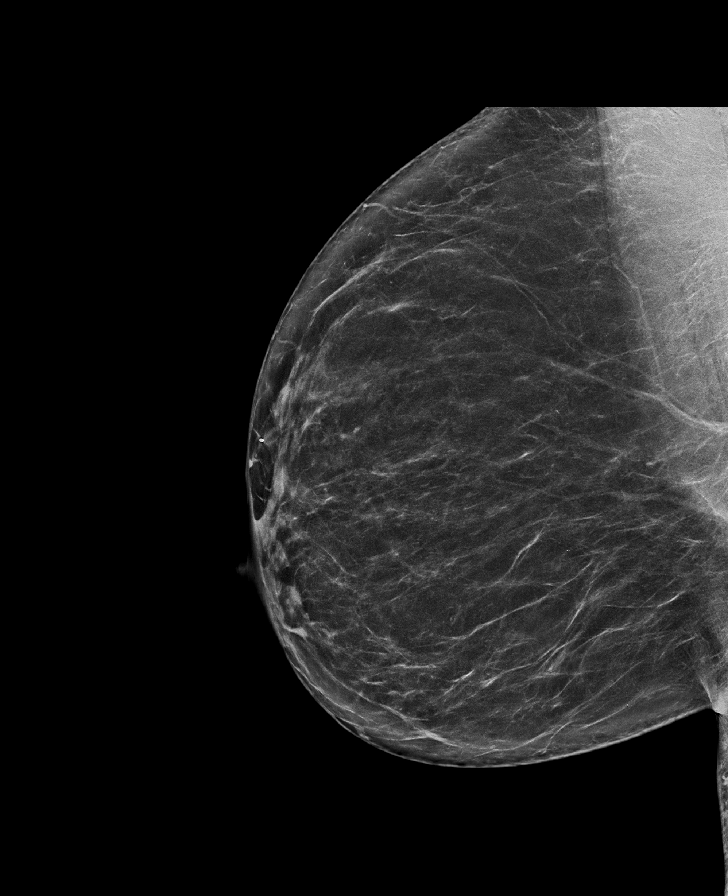

[L CC synth-2D]
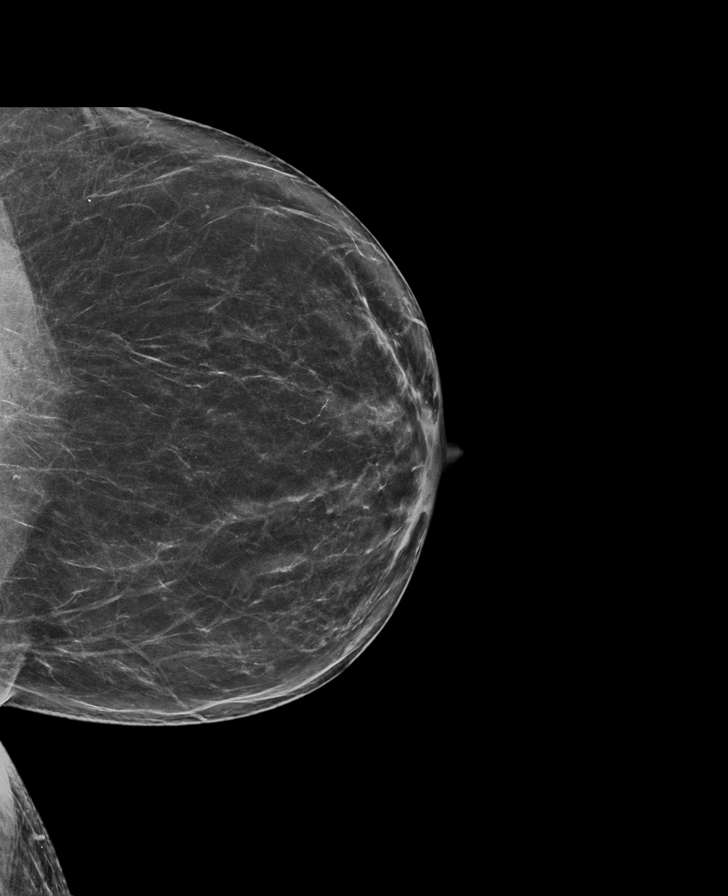

[L MLO synth-2D]
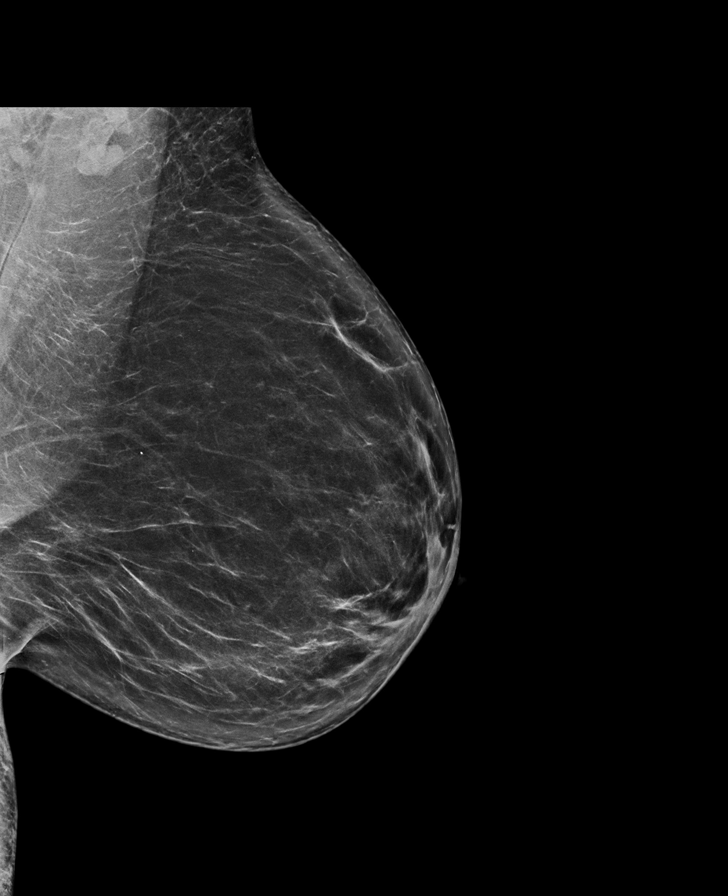

[L MLO tomo · tomo slice 43/86.0]
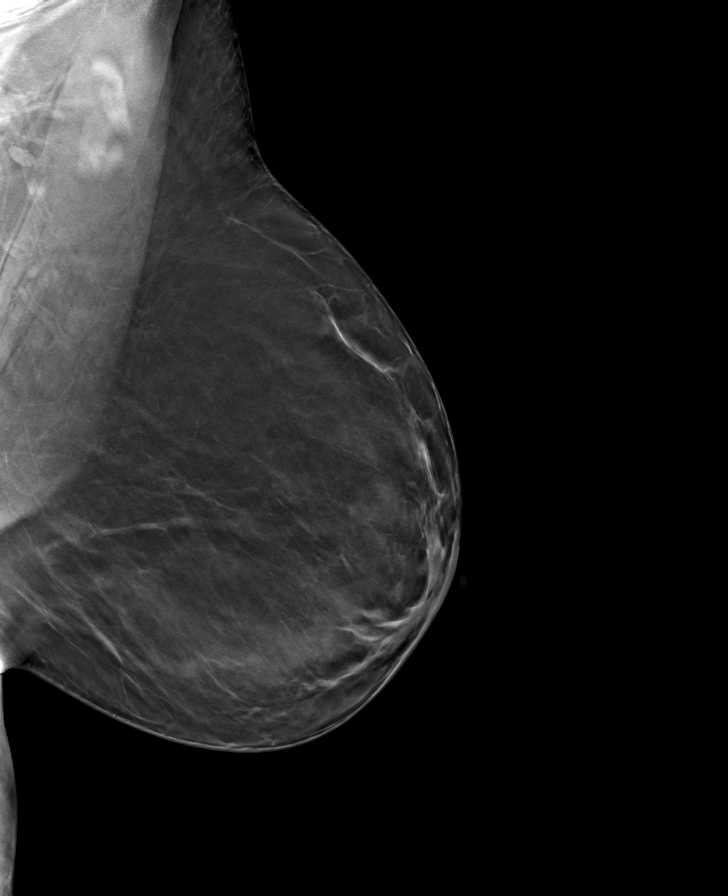

[R CC tomo · tomo slice 37/74.0]
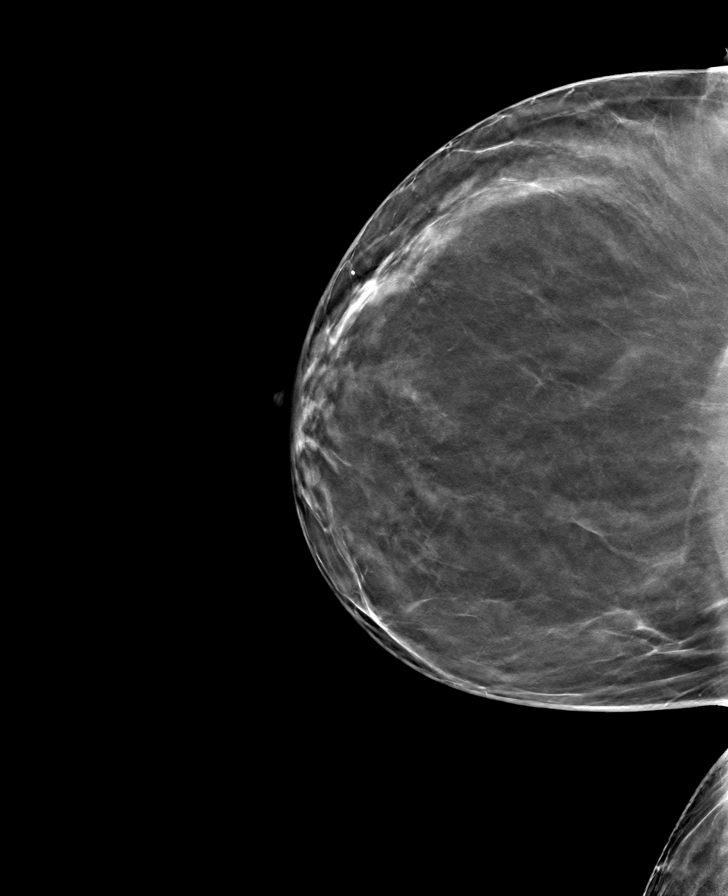

[L CC tomo · tomo slice 37/72.0]
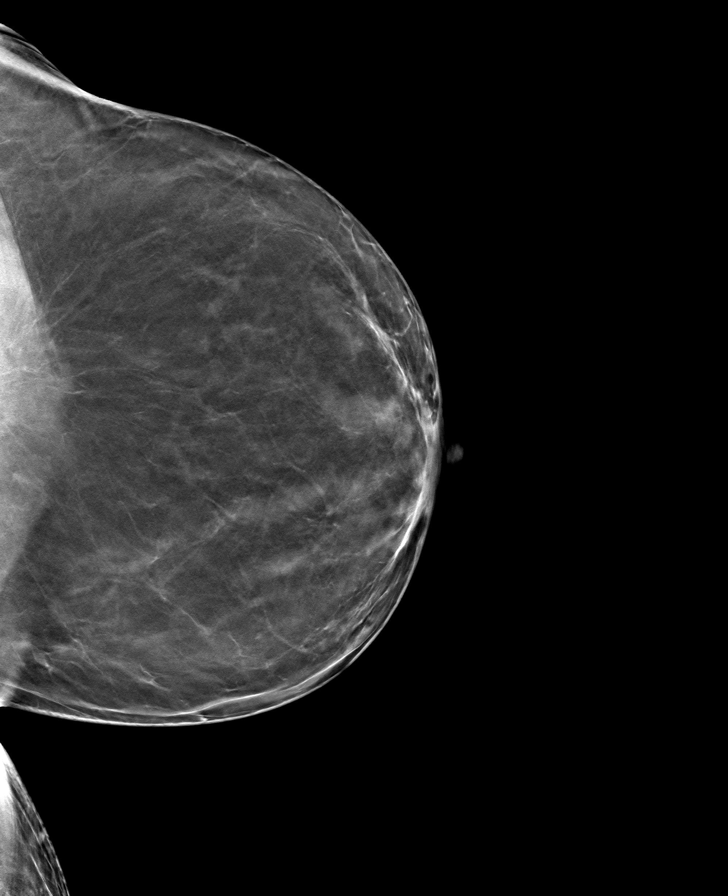

[R MLO tomo · tomo slice 41/80.0]
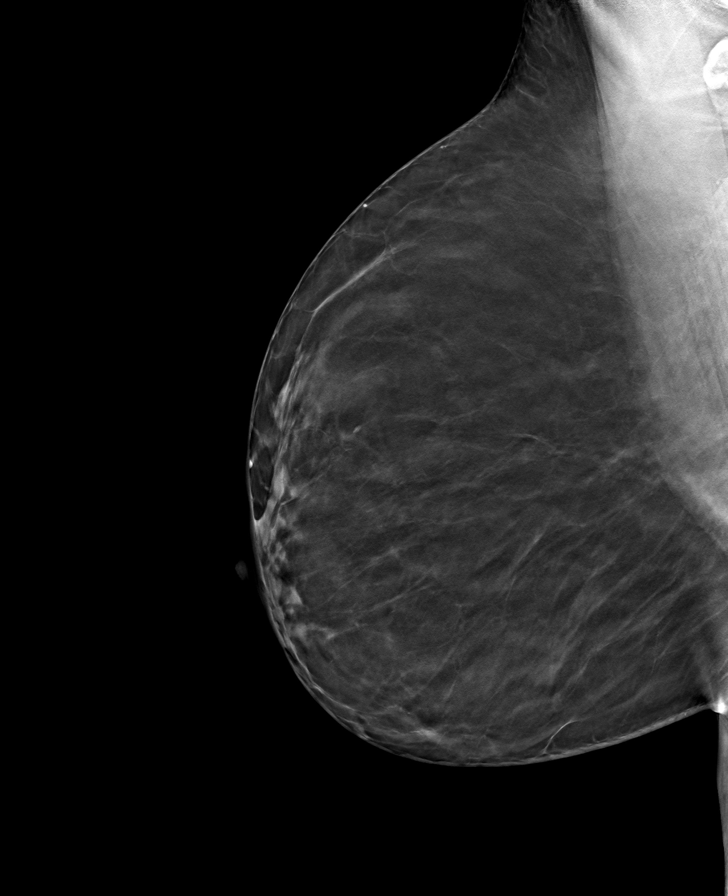

[8 of 24 positions shown; findings below may reference images not displayed]

ACR Breast Density Category b: There are scattered areas of
fibroglandular density.
FINDINGS: There are no findings suspicious for malignancy. Images were
processed with CAD.
IMPRESSION: No mammographic evidence of malignancy. A result letter of this
screening mammogram will be mailed directly to the patient.

RECOMMENDATION:
Screening mammogram in one year. (Code:[id number])

BI-RADS CATEGORY  1: Negative.

## 2019-05-16 ENCOUNTER — Encounter: Payer: Self-pay | Admitting: Orthopaedic Surgery

## 2019-05-16 ENCOUNTER — Other Ambulatory Visit: Payer: Self-pay

## 2019-05-16 ENCOUNTER — Ambulatory Visit (INDEPENDENT_AMBULATORY_CARE_PROVIDER_SITE_OTHER): Payer: No Typology Code available for payment source | Admitting: Orthopaedic Surgery

## 2019-05-16 DIAGNOSIS — G8929 Other chronic pain: Secondary | ICD-10-CM

## 2019-05-16 DIAGNOSIS — M25511 Pain in right shoulder: Secondary | ICD-10-CM | POA: Diagnosis not present

## 2019-05-16 MED ORDER — METHYLPREDNISOLONE ACETATE 40 MG/ML IJ SUSP
40.0000 mg | INTRAMUSCULAR | Status: AC | PRN
  Administered 2019-05-16: 40 mg via INTRA_ARTICULAR

## 2019-05-16 MED ORDER — LIDOCAINE HCL 1 % IJ SOLN
3.0000 mL | INTRAMUSCULAR | Status: AC | PRN
  Administered 2019-05-16: 3 mL

## 2019-05-16 NOTE — Progress Notes (Signed)
Office Visit Note   Patient: Amanda [name]           Date of Birth: 09-22-1956           MRN: [id number] Visit Date: 05/16/2019              Requested by: Lin Landsman, [address] [place],  [place] [address] PCP: Lin Landsman, MD   Assessment & Plan: Visit Diagnoses:  1. Chronic right shoulder pain     Plan: I do feel that is reasonable to try a steroid injection in the subacromial space.  Having had these before she fully understands the risk and benefits of injections and tolerated today's injection well.  All question concerns were answered and addressed.  Follow-up is as needed.  Follow-Up Instructions: Return if symptoms worsen or fail to improve.   Orders:  Orders Placed This Encounter  Procedures  . Large Joint Inj   No orders of the defined types were placed in this encounter.     Procedures: Large Joint Inj: R subacromial bursa on 05/16/2019 9:27 AM Indications: pain and diagnostic evaluation Details: 22 G 1.5 in needle  Arthrogram: No  Medications: 3 mL lidocaine 1 %; 40 mg methylPREDNISolone acetate 40 MG/ML Outcome: tolerated well, no immediate complications Procedure, treatment alternatives, risks and benefits explained, specific risks discussed. Consent was given by the patient. Immediately prior to procedure a time out was called to verify the correct patient, procedure, equipment, support staff and site/side marked as required. Patient was prepped and draped in the usual sterile fashion.       Clinical Data: No additional findings.   Subjective: Chief Complaint  Patient presents with  . Right Shoulder - Pain  The patient is well-known to me.  She has a remote history of right shoulder arthroscopy with rotator cuff repair from a work-related injury and accident.  I have not seen her in some time.  The last time we injected her shoulder was in October 2018.  She has had recent worsening of right shoulder pain and comes in today for an  injection.  She is now diabetic and does report good function of her shoulder but is been very painful her.  She is 63 years old and very active.  Hurts with over activities and reaching in front of her.  It was a very difficult repair at the time but we were able to get the tissue repaired.  She has had no other acute changes in her medical status.  She did have successful spine surgery on her lumbar spine in July of last year.  HPI  Review of Systems She currently denies any headache, chest pain, shortness of breath, fever, chills, nausea, vomiting  Objective: Vital Signs: There were no vitals taken for this visit.  Physical Exam She is alert and orient x3 and in no acute distress Ortho Exam Examination of her right shoulder does show some signs of pain and impingement with mobility.  There is some slight weakness of the rotator cuff but good mobility overall.  She hurts a lot in the subacromial outlet and anteriorly. Specialty Comments:  No specialty comments available.  Imaging: No results found.   PMFS History: Patient Active Problem List   Diagnosis Date Noted  . Spondylolisthesis of lumbar region 04/19/2018  . Chronic right shoulder pain 08/18/2017  . Cervical spondylosis without myelopathy 11/23/2014  . ALLERGIC RHINITIS, SEASONAL 11/19/2008  . HYPERLIPIDEMIA 05/24/2008  . HOT FLASHES 05/24/2008  . COLON CANCER,  HX OF 05/24/2008  . CARPAL TUNNEL SYNDROME, BILATERAL, HX OF 05/24/2008   Past Medical History:  Diagnosis Date  . Anemia    Pt reports having to take iron supplements in the past but denies blood transfusion.  Marland Kitchen Anxiety   . Arthritis    degenerative cerv. spine  . Cancer ([place])    ca of the colon, 2005, rec'd chemo   . Headache    due to sinus problem & cerv spine   . Hyperlipidemia   . Sickle cell trait ([place])   . Sleep apnea 2008   +sleep apnea, has CPAP- doesn't use it     Family History  Problem Relation Age of Onset  . Breast cancer Neg Hx      Past Surgical History:  Procedure Laterality Date  . ABDOMINAL HYSTERECTOMY    . ANTERIOR CERVICAL DECOMP/DISCECTOMY FUSION N/A 11/23/2014   Procedure: Cervical three-four, Cervical four-five, Cervical five-six Anterior cervical decompression/diskectomy/fusion;  Surgeon: Erline Levine, MD;  Location: [facility];  Service: Neurosurgery;  Laterality: N/A;  C3-4 C4-5 C5-6 Anterior cervical decompression/diskectomy/fusion  . CARPAL TUNNEL RELEASE Bilateral 2011  . COLON SURGERY  2005   colon resection   . EYE SURGERY     lasik bilateral   . FOOT SURGERY Left   . HERNIA REPAIR     umbilical hernia repair- 2005  . MAXIMUM ACCESS (MAS)POSTERIOR LUMBAR INTERBODY FUSION (PLIF) 2 LEVEL N/A 04/19/2018   Procedure: L4-5 L5-S1 Maximum access posterior lumbar interbody fusion;  Surgeon: Erline Levine, MD;  Location: [facility];  Service: Neurosurgery;  Laterality: N/A;  L4-5 L5-S1 Maximum access posterior lumbar interbody fusion  . PORTACATH PLACEMENT  2005   & then later removed  . SHOULDER ARTHROSCOPY Right 01/2013   rotator cuff- still healing   . TUBAL LIGATION     Social History   Occupational History  . Not on file  Tobacco Use  . Smoking status: Never Smoker  . Smokeless tobacco: Never Used  Substance and Sexual Activity  . Alcohol use: No  . Drug use: No  . Sexual activity: Not on file

## 2019-07-05 DIAGNOSIS — H43313 Vitreous membranes and strands, bilateral: Secondary | ICD-10-CM | POA: Diagnosis not present

## 2019-07-05 DIAGNOSIS — H2513 Age-related nuclear cataract, bilateral: Secondary | ICD-10-CM | POA: Diagnosis not present

## 2019-07-05 DIAGNOSIS — Z0100 Encounter for examination of eyes and vision without abnormal findings: Secondary | ICD-10-CM | POA: Diagnosis not present

## 2019-07-05 DIAGNOSIS — H524 Presbyopia: Secondary | ICD-10-CM | POA: Diagnosis not present

## 2019-07-12 ENCOUNTER — Encounter: Payer: Self-pay | Admitting: Orthopaedic Surgery

## 2019-07-12 ENCOUNTER — Ambulatory Visit (INDEPENDENT_AMBULATORY_CARE_PROVIDER_SITE_OTHER): Payer: No Typology Code available for payment source | Admitting: Orthopaedic Surgery

## 2019-07-12 DIAGNOSIS — G8929 Other chronic pain: Secondary | ICD-10-CM

## 2019-07-12 DIAGNOSIS — R69 Illness, unspecified: Secondary | ICD-10-CM | POA: Diagnosis not present

## 2019-07-12 DIAGNOSIS — M25511 Pain in right shoulder: Secondary | ICD-10-CM

## 2019-07-12 MED ORDER — METHYLPREDNISOLONE 4 MG PO TABS: ORAL_TABLET | ORAL | 0 refills | Status: DC

## 2019-07-12 NOTE — Progress Notes (Signed)
The patient is well-known to [place].  She does have chronic right shoulder pain.  We just provided steroid injection in her right shoulder less than 8 weeks ago.  She would like to have 1 again today.  She is not a diabetic.  I stressed to her the detrimental effects of steroids can have on the body this soon especially in the same area and that it is too soon to provide a steroid injection.  She is on naproxen.  I recommended she continue naproxen.  I can put her shoulder through range of motion since still located well but she deftly has a lot of pain in the subacromial outlet.  I will send in a 6-day steroid taper.  I would like to see her back in 4 weeks to consider repeat injection then but at that visit I would like 3 views of her right shoulder.

## 2019-07-27 ENCOUNTER — Other Ambulatory Visit: Payer: Self-pay

## 2019-07-27 DIAGNOSIS — R6889 Other general symptoms and signs: Secondary | ICD-10-CM | POA: Diagnosis not present

## 2019-07-27 DIAGNOSIS — Z20822 Contact with and (suspected) exposure to covid-19: Secondary | ICD-10-CM

## 2019-07-29 LAB — NOVEL CORONAVIRUS, NAA: SARS-CoV-2, NAA: NOT DETECTED

## 2019-08-09 ENCOUNTER — Ambulatory Visit (INDEPENDENT_AMBULATORY_CARE_PROVIDER_SITE_OTHER): Payer: No Typology Code available for payment source | Admitting: Orthopaedic Surgery

## 2019-08-09 ENCOUNTER — Encounter: Payer: Self-pay | Admitting: Orthopaedic Surgery

## 2019-08-09 DIAGNOSIS — G8929 Other chronic pain: Secondary | ICD-10-CM

## 2019-08-09 DIAGNOSIS — S46211A Strain of muscle, fascia and tendon of other parts of biceps, right arm, initial encounter: Secondary | ICD-10-CM

## 2019-08-09 DIAGNOSIS — M25511 Pain in right shoulder: Secondary | ICD-10-CM | POA: Diagnosis not present

## 2019-08-09 NOTE — Progress Notes (Signed)
The patient is well-known to me.  She has been having chronic right shoulder issues for a long period of time.  She is even had right shoulder arthroscopic surgery.  About a month ago or more she was experiencing right shoulder pain.  I been seeing her recently since July.  We put her on a steroid taper at her last visit and before that had provided steroid injection subacromial outlet.  She reports that her shoulder is feeling much better overall but once the swelling subsided she did notice that her muscle was lower on her right side than the left side and she points her biceps muscle.  On exam she deftly has a proximal biceps tendon rupture.  She has excellent strength and good range of motion of the right shoulder and excellent strength of the upper extremity but certainly there is a rupture of the proximal biceps tendon.  There is still some pain along the area where the tendon would be at the bicipital groove and into the biceps muscle itself but overall function while she is doing well.  I gave reassurance that this should still do well with her wake time and she does not need the other intervention and she agrees with this as well.  She will continue naproxen as well as occasional Voltaren gel and intermittent ice and heat.  She will always have a cosmetic deformity of her proximal humerus at the biceps muscle and she understands this but I do not think she will have functional deficits from it.  All question concerns were answered addressed.  Follow-up will be as needed.

## 2019-08-12 DIAGNOSIS — R69 Illness, unspecified: Secondary | ICD-10-CM | POA: Diagnosis not present

## 2019-08-28 DIAGNOSIS — R69 Illness, unspecified: Secondary | ICD-10-CM | POA: Diagnosis not present

## 2019-09-28 DIAGNOSIS — E782 Mixed hyperlipidemia: Secondary | ICD-10-CM | POA: Diagnosis not present

## 2019-09-28 DIAGNOSIS — Z0000 Encounter for general adult medical examination without abnormal findings: Secondary | ICD-10-CM | POA: Diagnosis not present

## 2019-10-18 ENCOUNTER — Encounter: Payer: Self-pay | Admitting: Orthopaedic Surgery

## 2019-10-18 ENCOUNTER — Ambulatory Visit (INDEPENDENT_AMBULATORY_CARE_PROVIDER_SITE_OTHER): Payer: PRIVATE HEALTH INSURANCE | Admitting: Orthopaedic Surgery

## 2019-10-18 ENCOUNTER — Other Ambulatory Visit: Payer: Self-pay

## 2019-10-18 DIAGNOSIS — G8929 Other chronic pain: Secondary | ICD-10-CM

## 2019-10-18 DIAGNOSIS — M25511 Pain in right shoulder: Secondary | ICD-10-CM

## 2019-10-18 MED ORDER — METHYLPREDNISOLONE ACETATE 40 MG/ML IJ SUSP
40.0000 mg | INTRAMUSCULAR | Status: AC | PRN
  Administered 2019-10-18: 40 mg via INTRA_ARTICULAR

## 2019-10-18 MED ORDER — LIDOCAINE HCL 1 % IJ SOLN
3.0000 mL | INTRAMUSCULAR | Status: AC | PRN
  Administered 2019-10-18: 3 mL

## 2019-10-18 NOTE — Progress Notes (Signed)
Office Visit Note   Patient: Amanda [name]           Date of Birth: [birth year]           MRN: [id number] Visit Date: 10/18/2019              Requested by: Lin Landsman, [address] [place] [place],  [place] [address] PCP: Lin Landsman, MD   Assessment & Plan: Visit Diagnoses:  1. Chronic right shoulder pain     Plan: Per her wishes I did provide a steroid injection in her right shoulder subacromial space which he tolerated well.  All questions concerns were answered and addressed.  She will still use her shoulder as comfort allows.  Follow-up will be as needed.  Follow-Up Instructions: Return if symptoms worsen or fail to improve.   Orders:  Orders Placed This Encounter  Procedures  . Large Joint Inj   No orders of the defined types were placed in this encounter.     Procedures: Large Joint Inj: R subacromial bursa on 10/18/2019 2:59 PM Indications: pain and diagnostic evaluation Details: 22 G 1.5 in needle  Arthrogram: No  Medications: 3 mL lidocaine 1 %; 40 mg methylPREDNISolone acetate 40 MG/ML Outcome: tolerated well, no immediate complications Procedure, treatment alternatives, risks and benefits explained, specific risks discussed. Consent was given by the patient. Immediately prior to procedure a time out was called to verify the correct patient, procedure, equipment, support staff and site/side marked as required. Patient was prepped and draped in the usual sterile fashion.       Clinical Data: No additional findings.   Subjective: Chief Complaint  Patient presents with  . Right Shoulder - Pain  The patient is well-known to me.  She is about 5 years out from a right shoulder arthroscopy and rotator cuff repair.  More recently this year she did sustain a biceps tendon rupture of the proximal biceps tendon.  She is still experiencing pain to the right shoulder and would like to consider a subacromial steroid injection today.  She says is just chronically  painful to her but there is no significant weakness.  HPI  Review of Systems She currently denies any headache, chest pain, shortness of breath, fever, chills, nausea, vomiting  Objective: Vital Signs: There were no vitals taken for this visit.  Physical Exam She is alert and orient x3 and in no acute distress Ortho Exam Examination of her right shoulder shows is well located.  She does have a chronic proximal biceps tendon rupture with the biceps muscle being lower.  She has good strength in the cuff itself.  She has excellent range of motion of that shoulder. Specialty Comments:  No specialty comments available.  Imaging: No results found.   PMFS History: Patient Active Problem List   Diagnosis Date Noted  . Spondylolisthesis of lumbar region 04/19/2018  . Chronic right shoulder pain 08/18/2017  . Cervical spondylosis without myelopathy 11/23/2014  . ALLERGIC RHINITIS, SEASONAL 11/19/2008  . HYPERLIPIDEMIA 05/24/2008  . HOT FLASHES 05/24/2008  . COLON CANCER, HX OF 05/24/2008  . CARPAL TUNNEL SYNDROME, BILATERAL, HX OF 05/24/2008   Past Medical History:  Diagnosis Date  . Anemia    Pt reports having to take iron supplements in the past but denies blood transfusion.  Marland Kitchen Anxiety   . Arthritis    degenerative cerv. spine  . Cancer ([place])    ca of the colon, 2005, rec'd chemo   . Headache    due to  sinus problem & cerv spine   . Hyperlipidemia   . Sickle cell trait ([place])   . Sleep apnea 2008   +sleep apnea, has CPAP- doesn't use it     Family History  Problem Relation Age of Onset  . Breast cancer Neg Hx     Past Surgical History:  Procedure Laterality Date  . ABDOMINAL HYSTERECTOMY    . ANTERIOR CERVICAL DECOMP/DISCECTOMY FUSION N/A 11/23/2014   Procedure: Cervical three-four, Cervical four-five, Cervical five-six Anterior cervical decompression/diskectomy/fusion;  Surgeon: Erline Levine, MD;  Location: [facility];  Service: Neurosurgery;  Laterality: N/A;  C3-4 C4-5  C5-6 Anterior cervical decompression/diskectomy/fusion  . CARPAL TUNNEL RELEASE Bilateral 2011  . COLON SURGERY  2005   colon resection   . EYE SURGERY     lasik bilateral   . FOOT SURGERY Left   . HERNIA REPAIR     umbilical hernia repair- 2005  . MAXIMUM ACCESS (MAS)POSTERIOR LUMBAR INTERBODY FUSION (PLIF) 2 LEVEL N/A 04/19/2018   Procedure: L4-5 L5-S1 Maximum access posterior lumbar interbody fusion;  Surgeon: Erline Levine, MD;  Location: [facility];  Service: Neurosurgery;  Laterality: N/A;  L4-5 L5-S1 Maximum access posterior lumbar interbody fusion  . PORTACATH PLACEMENT  2005   & then later removed  . SHOULDER ARTHROSCOPY Right 01/2013   rotator cuff- still healing   . TUBAL LIGATION     Social History   Occupational History  . Not on file  Tobacco Use  . Smoking status: Never Smoker  . Smokeless tobacco: Never Used  Substance and Sexual Activity  . Alcohol use: No  . Drug use: No  . Sexual activity: Not on file

## 2019-11-06 ENCOUNTER — Other Ambulatory Visit: Payer: Self-pay

## 2019-11-06 ENCOUNTER — Ambulatory Visit
Admission: RE | Admit: 2019-11-06 | Discharge: 2019-11-06 | Disposition: A | Discharge status: Home / Self Care | Payer: Medicare HMO | Source: Ambulatory Visit | Attending: Oncology | Admitting: Oncology

## 2019-11-06 DIAGNOSIS — Z1231 Encounter for screening mammogram for malignant neoplasm of breast: Secondary | ICD-10-CM

## 2019-11-07 DIAGNOSIS — R10811 Right upper quadrant abdominal tenderness: Secondary | ICD-10-CM | POA: Diagnosis not present

## 2019-11-07 DIAGNOSIS — K5901 Slow transit constipation: Secondary | ICD-10-CM | POA: Diagnosis not present

## 2019-11-28 DIAGNOSIS — L668 Other cicatricial alopecia: Secondary | ICD-10-CM | POA: Diagnosis not present

## 2019-11-29 ENCOUNTER — Other Ambulatory Visit: Payer: Self-pay

## 2019-11-29 ENCOUNTER — Ambulatory Visit (INDEPENDENT_AMBULATORY_CARE_PROVIDER_SITE_OTHER): Payer: No Typology Code available for payment source | Admitting: Physician Assistant

## 2019-11-29 ENCOUNTER — Ambulatory Visit (INDEPENDENT_AMBULATORY_CARE_PROVIDER_SITE_OTHER): Payer: No Typology Code available for payment source

## 2019-11-29 ENCOUNTER — Encounter: Payer: Self-pay | Admitting: Physician Assistant

## 2019-11-29 DIAGNOSIS — M542 Cervicalgia: Secondary | ICD-10-CM | POA: Diagnosis not present

## 2019-11-29 MED ORDER — METHYLPREDNISOLONE 4 MG PO TABS: ORAL_TABLET | ORAL | 0 refills | Status: AC

## 2019-11-29 NOTE — Progress Notes (Signed)
Office Visit Note   Patient: Amanda [name]           Date of Birth: 03-16-1956           MRN: [id number] Visit Date: 11/29/2019              Requested by: Lin Landsman, [address] [place],  [place] [address] PCP: Lin Landsman, MD   Assessment & Plan: Visit Diagnoses:  1. Neck pain     Plan: Her back. We will place her on a Medrol Dosepak.  Have her take her Zanaflex at night.  Moist heat at night.  Her pain persist in 2 weeks she will call Dr. Vertell Limber who operated on her back in 2016.  She is an established patient saw him in 2019 for her back  Follow-Up Instructions: Return if symptoms worsen or fail to improve.   Orders:  Orders Placed This Encounter  Procedures  . XR Cervical Spine 2 or 3 views   Meds ordered this encounter  Medications  . methylPREDNISolone (MEDROL) 4 MG tablet    Sig: Take as directed    Dispense:  21 tablet    Refill:  0      Procedures: No procedures performed   Clinical Data: No additional findings.   Subjective: Chief Complaint  Patient presents with  . Neck - Pain    HPI Amanda [name] is a 64 year old female well-known to our department service comes in today with lower neck pain.  No radicular symptoms down either arm.  No known injury.  She states pain is worse with range of motion of the neck.  Status post anterior C3-C6 fusion by Dr. Vertell Limber 11/23/2014.  Is done well until recently.  She is tried ibuprofen, oral diclofenac and topical diclofenac gel.  Despite this continues to have pain in the lower neck.  Pain does not awaken her. Review of Systems See HPI otherwise negative or noncontributory.  Objective: Vital Signs: There were no vitals taken for this visit.  Physical Exam Constitutional:      General: She is not in acute distress.    Appearance: She is normal weight. She is not diaphoretic.  Cardiovascular:     Pulses: Normal pulses.  Neurological:     Mental Status: She is alert and oriented to person, place, and  time.  Psychiatric:        Mood and Affect: Mood normal.        Behavior: Behavior normal.     Ortho Exam Cervical spine she has limited flexion-extension cervical spine.  Negative Spurling's.  She has tenderness over the lower cervical upper thoracic spine with palpation.  No tenderness along medial borders about her scapula.  Out of five strength throughout the upper extremities against resistance.  Sensation grossly intact bilateral hands and  full motor bilateral hands.  Specialty Comments:  No specialty comments available.  Imaging: XR Cervical Spine 2 or 3 views  Result Date: 11/29/2019 Cervical spine 2 views: No acute fractures.  Status post cervical fusion C3-C6.  No obvious hardware failure.  No spondylolisthesis.  Loss of disc space at C6-C7.    PMFS History: Patient Active Problem List   Diagnosis Date Noted  . Spondylolisthesis of lumbar region 04/19/2018  . Chronic right shoulder pain 08/18/2017  . Cervical spondylosis without myelopathy 11/23/2014  . ALLERGIC RHINITIS, SEASONAL 11/19/2008  . HYPERLIPIDEMIA 05/24/2008  . HOT FLASHES 05/24/2008  . COLON CANCER, HX OF 05/24/2008  . CARPAL TUNNEL SYNDROME, BILATERAL, HX  OF 05/24/2008   Past Medical History:  Diagnosis Date  . Anemia    Pt reports having to take iron supplements in the past but denies blood transfusion.  Marland Kitchen Anxiety   . Arthritis    degenerative cerv. spine  . Cancer ([place])    ca of the colon, 2005, rec'd chemo   . Headache    due to sinus problem & cerv spine   . Hyperlipidemia   . Sickle cell trait ([place])   . Sleep apnea 2008   +sleep apnea, has CPAP- doesn't use it     Family History  Problem Relation Age of Onset  . Breast cancer Neg Hx     Past Surgical History:  Procedure Laterality Date  . ABDOMINAL HYSTERECTOMY    . ANTERIOR CERVICAL DECOMP/DISCECTOMY FUSION N/A 11/23/2014   Procedure: Cervical three-four, Cervical four-five, Cervical five-six Anterior cervical  decompression/diskectomy/fusion;  Surgeon: Erline Levine, MD;  Location: [facility];  Service: Neurosurgery;  Laterality: N/A;  C3-4 C4-5 C5-6 Anterior cervical decompression/diskectomy/fusion  . CARPAL TUNNEL RELEASE Bilateral 2011  . COLON SURGERY  2005   colon resection   . EYE SURGERY     lasik bilateral   . FOOT SURGERY Left   . HERNIA REPAIR     umbilical hernia repair- 2005  . MAXIMUM ACCESS (MAS)POSTERIOR LUMBAR INTERBODY FUSION (PLIF) 2 LEVEL N/A 04/19/2018   Procedure: L4-5 L5-S1 Maximum access posterior lumbar interbody fusion;  Surgeon: Erline Levine, MD;  Location: [facility];  Service: Neurosurgery;  Laterality: N/A;  L4-5 L5-S1 Maximum access posterior lumbar interbody fusion  . PORTACATH PLACEMENT  2005   & then later removed  . SHOULDER ARTHROSCOPY Right 01/2013   rotator cuff- still healing   . TUBAL LIGATION     Social History   Occupational History  . Not on file  Tobacco Use  . Smoking status: Never Smoker  . Smokeless tobacco: Never Used  Substance and Sexual Activity  . Alcohol use: No  . Drug use: No  . Sexual activity: Not on file

## 2019-12-11 ENCOUNTER — Other Ambulatory Visit: Payer: Self-pay | Admitting: Physician Assistant

## 2019-12-11 DIAGNOSIS — R109 Unspecified abdominal pain: Secondary | ICD-10-CM

## 2019-12-11 DIAGNOSIS — R1011 Right upper quadrant pain: Secondary | ICD-10-CM

## 2019-12-11 DIAGNOSIS — R1013 Epigastric pain: Secondary | ICD-10-CM | POA: Diagnosis not present

## 2019-12-11 DIAGNOSIS — Z85038 Personal history of other malignant neoplasm of large intestine: Secondary | ICD-10-CM | POA: Diagnosis not present

## 2019-12-13 DIAGNOSIS — R030 Elevated blood-pressure reading, without diagnosis of hypertension: Secondary | ICD-10-CM | POA: Diagnosis not present

## 2019-12-13 DIAGNOSIS — G4700 Insomnia, unspecified: Secondary | ICD-10-CM | POA: Diagnosis not present

## 2019-12-13 DIAGNOSIS — G629 Polyneuropathy, unspecified: Secondary | ICD-10-CM | POA: Diagnosis not present

## 2019-12-13 DIAGNOSIS — M1990 Unspecified osteoarthritis, unspecified site: Secondary | ICD-10-CM | POA: Diagnosis not present

## 2019-12-13 DIAGNOSIS — N951 Menopausal and female climacteric states: Secondary | ICD-10-CM | POA: Diagnosis not present

## 2019-12-13 DIAGNOSIS — E785 Hyperlipidemia, unspecified: Secondary | ICD-10-CM | POA: Diagnosis not present

## 2019-12-13 DIAGNOSIS — K219 Gastro-esophageal reflux disease without esophagitis: Secondary | ICD-10-CM | POA: Diagnosis not present

## 2019-12-13 DIAGNOSIS — M5430 Sciatica, unspecified side: Secondary | ICD-10-CM | POA: Diagnosis not present

## 2019-12-13 DIAGNOSIS — G8929 Other chronic pain: Secondary | ICD-10-CM | POA: Diagnosis not present

## 2019-12-13 DIAGNOSIS — M62838 Other muscle spasm: Secondary | ICD-10-CM | POA: Diagnosis not present

## 2019-12-15 ENCOUNTER — Ambulatory Visit
Admission: RE | Admit: 2019-12-15 | Discharge: 2019-12-15 | Disposition: A | Discharge status: Home / Self Care | Payer: PRIVATE HEALTH INSURANCE | Source: Ambulatory Visit | Attending: Physician Assistant | Admitting: Physician Assistant

## 2019-12-15 DIAGNOSIS — R1011 Right upper quadrant pain: Secondary | ICD-10-CM

## 2019-12-15 DIAGNOSIS — R109 Unspecified abdominal pain: Secondary | ICD-10-CM

## 2019-12-15 DIAGNOSIS — N2889 Other specified disorders of kidney and ureter: Secondary | ICD-10-CM | POA: Diagnosis not present

## 2019-12-26 DIAGNOSIS — K5901 Slow transit constipation: Secondary | ICD-10-CM | POA: Diagnosis not present

## 2019-12-26 DIAGNOSIS — R1030 Lower abdominal pain, unspecified: Secondary | ICD-10-CM | POA: Diagnosis not present

## 2019-12-26 DIAGNOSIS — E782 Mixed hyperlipidemia: Secondary | ICD-10-CM | POA: Diagnosis not present

## 2020-01-08 DIAGNOSIS — Z85038 Personal history of other malignant neoplasm of large intestine: Secondary | ICD-10-CM | POA: Diagnosis not present

## 2020-01-08 DIAGNOSIS — R1011 Right upper quadrant pain: Secondary | ICD-10-CM | POA: Diagnosis not present

## 2020-01-09 DIAGNOSIS — D485 Neoplasm of uncertain behavior of skin: Secondary | ICD-10-CM | POA: Diagnosis not present

## 2020-01-09 DIAGNOSIS — L989 Disorder of the skin and subcutaneous tissue, unspecified: Secondary | ICD-10-CM | POA: Diagnosis not present

## 2020-01-19 DIAGNOSIS — L668 Other cicatricial alopecia: Secondary | ICD-10-CM | POA: Diagnosis not present

## 2020-02-26 DIAGNOSIS — L668 Other cicatricial alopecia: Secondary | ICD-10-CM | POA: Diagnosis not present

## 2020-02-28 ENCOUNTER — Other Ambulatory Visit: Payer: Self-pay | Admitting: Oncology

## 2020-02-28 DIAGNOSIS — Z1231 Encounter for screening mammogram for malignant neoplasm of breast: Secondary | ICD-10-CM

## 2020-04-23 ENCOUNTER — Other Ambulatory Visit: Payer: Self-pay | Admitting: Neurosurgery

## 2020-04-23 DIAGNOSIS — M4316 Spondylolisthesis, lumbar region: Secondary | ICD-10-CM

## 2020-05-03 DIAGNOSIS — L668 Other cicatricial alopecia: Secondary | ICD-10-CM | POA: Diagnosis not present

## 2020-05-03 DIAGNOSIS — L811 Chloasma: Secondary | ICD-10-CM | POA: Diagnosis not present

## 2020-05-23 ENCOUNTER — Other Ambulatory Visit: Payer: Medicare HMO

## 2020-05-25 ENCOUNTER — Ambulatory Visit
Admission: RE | Admit: 2020-05-25 | Discharge: 2020-05-25 | Disposition: A | Discharge status: Home / Self Care | Payer: Medicare HMO | Source: Ambulatory Visit | Attending: Neurosurgery | Admitting: Neurosurgery

## 2020-05-25 DIAGNOSIS — M4316 Spondylolisthesis, lumbar region: Secondary | ICD-10-CM

## 2020-05-25 DIAGNOSIS — M48061 Spinal stenosis, lumbar region without neurogenic claudication: Secondary | ICD-10-CM | POA: Diagnosis not present

## 2020-05-25 MED ORDER — GADOBENATE DIMEGLUMINE 529 MG/ML IV SOLN
13.0000 mL | Freq: Once | INTRAVENOUS | Status: AC | PRN
  Administered 2020-05-25: 13 mL via INTRAVENOUS

## 2020-06-19 DIAGNOSIS — R69 Illness, unspecified: Secondary | ICD-10-CM | POA: Diagnosis not present

## 2020-06-24 DIAGNOSIS — H524 Presbyopia: Secondary | ICD-10-CM | POA: Diagnosis not present

## 2020-07-02 DIAGNOSIS — L668 Other cicatricial alopecia: Secondary | ICD-10-CM | POA: Diagnosis not present

## 2020-08-09 DIAGNOSIS — R69 Illness, unspecified: Secondary | ICD-10-CM | POA: Diagnosis not present

## 2020-08-13 DIAGNOSIS — L668 Other cicatricial alopecia: Secondary | ICD-10-CM | POA: Diagnosis not present

## 2020-09-26 DIAGNOSIS — L668 Other cicatricial alopecia: Secondary | ICD-10-CM | POA: Diagnosis not present

## 2020-10-01 DIAGNOSIS — Z13220 Encounter for screening for lipoid disorders: Secondary | ICD-10-CM | POA: Diagnosis not present

## 2020-10-01 DIAGNOSIS — E782 Mixed hyperlipidemia: Secondary | ICD-10-CM | POA: Diagnosis not present

## 2020-10-01 DIAGNOSIS — Z13228 Encounter for screening for other metabolic disorders: Secondary | ICD-10-CM | POA: Diagnosis not present

## 2020-10-01 DIAGNOSIS — Z0000 Encounter for general adult medical examination without abnormal findings: Secondary | ICD-10-CM | POA: Diagnosis not present

## 2020-11-06 ENCOUNTER — Ambulatory Visit
Admission: RE | Admit: 2020-11-06 | Discharge: 2020-11-06 | Disposition: A | Discharge status: Home / Self Care | Payer: Medicare HMO | Source: Ambulatory Visit | Attending: Oncology | Admitting: Oncology

## 2020-11-06 ENCOUNTER — Other Ambulatory Visit: Payer: Self-pay

## 2020-11-06 DIAGNOSIS — Z1231 Encounter for screening mammogram for malignant neoplasm of breast: Secondary | ICD-10-CM | POA: Diagnosis not present

## 2020-11-06 DIAGNOSIS — L668 Other cicatricial alopecia: Secondary | ICD-10-CM | POA: Diagnosis not present

## 2021-10-07 ENCOUNTER — Other Ambulatory Visit: Payer: Self-pay | Admitting: Oncology

## 2021-10-07 DIAGNOSIS — Z1231 Encounter for screening mammogram for malignant neoplasm of breast: Secondary | ICD-10-CM

## 2021-11-11 ENCOUNTER — Ambulatory Visit
Admission: RE | Admit: 2021-11-11 | Discharge: 2021-11-11 | Disposition: A | Discharge status: Home / Self Care | Payer: Medicare HMO | Source: Ambulatory Visit | Attending: Oncology | Admitting: Oncology

## 2021-11-11 DIAGNOSIS — Z1231 Encounter for screening mammogram for malignant neoplasm of breast: Secondary | ICD-10-CM

## 2021-11-17 ENCOUNTER — Other Ambulatory Visit: Payer: Self-pay | Admitting: Family Medicine

## 2021-11-17 DIAGNOSIS — Z1231 Encounter for screening mammogram for malignant neoplasm of breast: Secondary | ICD-10-CM

## 2022-10-29 ENCOUNTER — Ambulatory Visit: Payer: Medicare HMO | Admitting: Physician Assistant

## 2022-10-29 ENCOUNTER — Ambulatory Visit (INDEPENDENT_AMBULATORY_CARE_PROVIDER_SITE_OTHER): Payer: Medicare HMO

## 2022-10-29 ENCOUNTER — Encounter: Payer: Self-pay | Admitting: Physician Assistant

## 2022-10-29 DIAGNOSIS — M545 Low back pain, unspecified: Secondary | ICD-10-CM | POA: Diagnosis not present

## 2022-10-29 DIAGNOSIS — M25511 Pain in right shoulder: Secondary | ICD-10-CM | POA: Diagnosis not present

## 2022-10-29 MED ORDER — LIDOCAINE HCL 1 % IJ SOLN
3.0000 mL | INTRAMUSCULAR | Status: AC | PRN
  Administered 2022-10-29: 3 mL

## 2022-10-29 MED ORDER — METHYLPREDNISOLONE ACETATE 40 MG/ML IJ SUSP
40.0000 mg | INTRAMUSCULAR | Status: AC | PRN
  Administered 2022-10-29: 40 mg via INTRA_ARTICULAR

## 2022-10-29 NOTE — Progress Notes (Signed)
Office Visit Note   Patient: Amanda [name]           Date of Birth: 1956/03/14           MRN: [id number] Visit Date: 10/29/2022              Requested by: Amanda [name], Amanda [name],  [place] [address] PCP: Amanda Landsman, MD   Assessment & Plan: Visit Diagnoses:  1. Acute pain of right shoulder   2. Acute midline low back pain without sciatica     Plan: We will send her for therapy to her low back mostly modalities to the area of maximal tenderness and include dry needling, time a TENS unit or any other modalities deemed appropriate by physical therapy.  They can also work on back exercises.  In regards to her shoulder she is shown shoulder exercises to perform on her own.  Will see her back if 4 weeks after starting therapy for back to see how she is doing in regards to her back and her shoulder she can always return sooner if there is any questions or concerns.  Follow-Up Instructions: Return in about 4 weeks (around 11/26/2022).   Orders:  Orders Placed This Encounter  Procedures   Large Joint Inj   XR Shoulder Right   Ambulatory referral to Physical Therapy   No orders of the defined types were placed in this encounter.     Procedures: Large Joint Inj: R subacromial bursa on 10/29/2022 12:30 PM Indications: pain Details: 22 G 1.5 in needle, superior approach  Arthrogram: No  Medications: 3 mL lidocaine 1 %; 40 mg methylPREDNISolone acetate 40 MG/ML Outcome: tolerated well, no immediate complications Procedure, treatment alternatives, risks and benefits explained, specific risks discussed. Consent was given by the patient. Immediately prior to procedure a time out was called to verify the correct patient, procedure, equipment, support staff and site/side marked as required. Patient was prepped and draped in the usual sterile fashion.       Clinical Data: No additional findings.   Subjective: Chief Complaint  Patient presents with   Right  Shoulder - Pain   Lower Back - Pain    HPI Amanda [name] comes in today for right shoulder pain.  Pains been ongoing for the past 2 to 3 months no known injury.  Pain with range of motion particularly overhead.  She is also having some low back pain without radicular symptoms.  She had previous back surgery with fusion multiple levels done by Amanda [name] in the past.  She was placed on a prednisone Dosepak recently and this was helpful.  Also she feels that overall rest helped.   Review of Systems  Constitutional:  Negative for chills and fever.  Musculoskeletal:  Positive for back pain.     Objective: Vital Signs: There were no vitals taken for this visit.  Physical Exam Constitutional:      Appearance: She is not ill-appearing or diaphoretic.  Cardiovascular:     Pulses: Normal pulses.  Pulmonary:     Effort: Pulmonary effort is normal.  Neurological:     Mental Status: She is alert and oriented to person, place, and time.  Psychiatric:        Mood and Affect: Mood normal.     Ortho Exam Bilateral shoulders 5 out of 5 strength with external and internal rotation.  Empty can test is negative bilaterally.  Impingement testing positive on the right negative on the left.  She  has full overhead activity actively both shoulders.  Liftoff test is negative bilaterally. Lower extremities 5 out of 5 strength throughout lower extremities against resistance.  Negative straight leg raise bilaterally.  Good range of motion bilateral hips.  She has full forward flexion lumbar spine and extension lumbar spine.  Tenderness over the right paraspinous region at the level of L3-4.  Otherwise nontender.  No rashes skin lesions ulcerations over the lower back.  Specialty Comments:  No specialty comments available.  Imaging: XR Shoulder Right  Result Date: 10/29/2022 Right shoulder 3 views: Shoulder is well located.  No significant arthritic changes.  Type II downsloping acromion.  No acute  fractures or acute findings.    PMFS History: Patient Active Problem List   Diagnosis Date Noted   Spondylolisthesis of lumbar region 04/19/2018   Chronic right shoulder pain 08/18/2017   Cervical spondylosis without myelopathy 11/23/2014   ALLERGIC RHINITIS, SEASONAL 11/19/2008   HYPERLIPIDEMIA 05/24/2008   HOT FLASHES 05/24/2008   COLON CANCER, HX OF 05/24/2008   CARPAL TUNNEL SYNDROME, BILATERAL, HX OF 05/24/2008   Past Medical History:  Diagnosis Date   Anemia    Pt reports having to take iron supplements in the past but denies blood transfusion.   Anxiety    Arthritis    degenerative cerv. spine   Cancer ([place])    ca of the colon, 2005, rec'd chemo    Headache    due to sinus problem & cerv spine    Hyperlipidemia    Sickle cell trait ([place])    Sleep apnea 2008   +sleep apnea, has CPAP- doesn't use it     Family History  Problem Relation Age of Onset   Breast cancer Neg Hx     Past Surgical History:  Procedure Laterality Date   ABDOMINAL HYSTERECTOMY     ANTERIOR CERVICAL DECOMP/DISCECTOMY FUSION N/A 11/23/2014   Procedure: Cervical three-four, Cervical four-five, Cervical five-six Anterior cervical decompression/diskectomy/fusion;  Surgeon: Amanda Levine, MD;  Location: [facility];  Service: Neurosurgery;  Laterality: N/A;  C3-4 C4-5 C5-6 Anterior cervical decompression/diskectomy/fusion   CARPAL TUNNEL RELEASE Bilateral 2011   COLON SURGERY  2005   colon resection    EYE SURGERY     lasik bilateral    FOOT SURGERY Left    HERNIA REPAIR     umbilical hernia repair- 2005   MAXIMUM ACCESS (MAS)POSTERIOR LUMBAR INTERBODY FUSION (PLIF) 2 LEVEL N/A 04/19/2018   Procedure: L4-5 L5-S1 Maximum access posterior lumbar interbody fusion;  Surgeon: Amanda Levine, MD;  Location: [facility];  Service: Neurosurgery;  Laterality: N/A;  L4-5 L5-S1 Maximum access posterior lumbar interbody fusion   PORTACATH PLACEMENT  2005   & then later removed   SHOULDER ARTHROSCOPY Right 01/2013    rotator cuff- still healing    TUBAL LIGATION     Social History   Occupational History   Not on file  Tobacco Use   Smoking status: Never   Smokeless tobacco: Never  Vaping Use   Vaping Use: Never used  Substance and Sexual Activity   Alcohol use: No   Drug use: No   Sexual activity: Not on file

## 2022-11-10 ENCOUNTER — Ambulatory Visit (HOSPITAL_COMMUNITY)
Admission: RE | Admit: 2022-11-10 | Discharge: 2022-11-10 | Disposition: A | Discharge status: Home / Self Care | Payer: Medicare HMO | Source: Ambulatory Visit | Attending: Internal Medicine | Admitting: Internal Medicine

## 2022-11-10 ENCOUNTER — Encounter (HOSPITAL_COMMUNITY): Payer: Self-pay

## 2022-11-10 VITALS — BP 149/77 | HR 87 | Temp 98.3°F | Resp 18

## 2022-11-10 DIAGNOSIS — M546 Pain in thoracic spine: Secondary | ICD-10-CM | POA: Diagnosis present

## 2022-11-10 DIAGNOSIS — R1011 Right upper quadrant pain: Secondary | ICD-10-CM | POA: Diagnosis present

## 2022-11-10 DIAGNOSIS — G8929 Other chronic pain: Secondary | ICD-10-CM | POA: Insufficient documentation

## 2022-11-10 LAB — CBC
HCT: 40.9 % (ref 36.0–46.0)
Hemoglobin: 13.9 g/dL (ref 12.0–15.0)
MCH: 30.8 pg (ref 26.0–34.0)
MCHC: 34 g/dL (ref 30.0–36.0)
MCV: 90.5 fL (ref 80.0–100.0)
Platelets: 327 10*3/uL (ref 150–400)
RBC: 4.52 MIL/uL (ref 3.87–5.11)
RDW: 13.2 % (ref 11.5–15.5)
WBC: 7.8 10*3/uL (ref 4.0–10.5)
nRBC: 0 % (ref 0.0–0.2)

## 2022-11-10 MED ORDER — KETOROLAC TROMETHAMINE 30 MG/ML IJ SOLN
15.0000 mg | Freq: Once | INTRAMUSCULAR | Status: AC
  Administered 2022-11-10: 15 mg via INTRAMUSCULAR

## 2022-11-10 MED ORDER — KETOROLAC TROMETHAMINE 30 MG/ML IJ SOLN
INTRAMUSCULAR | Status: AC
  Filled 2022-11-10: qty 1

## 2022-11-10 MED ORDER — TIZANIDINE HCL 4 MG PO TABS: 4.0000 mg | ORAL_TABLET | Freq: Four times a day (QID) | ORAL | 0 refills | Status: AC | PRN

## 2022-11-10 NOTE — Discharge Instructions (Addendum)
Follow-up with your primary care provider on January 8 as scheduled. I think it would be beneficial for you to be seen again by your previous oncologist.  Your primary care provider will schedule this and work with you to get this done.  You may also reach out to them yourself to schedule an appointment.  I do some blood work in the clinic to assess for your electrolyte function and  your blood levels.  I will call you if any of your blood work is abnormal.  You were given a shot of Toradol in the clinic today.  This is a strong anti-inflammatory medicine that can help to reduce your pain and inflammation to the area. Do not take any ibuprofen until tomorrow since I gave you the Toradol injection in the clinic.  Continue using heat and gentle range of motion exercises to the your right side.  If you develop any new or worsening symptoms before your appointment on January 8, please go to the nearest emergency room for further evaluation and management of your pain.  Otherwise, you may follow-up with urgent care as needed.  I hope you feel better!

## 2022-11-10 NOTE — ED Provider Notes (Signed)
[facility]    CSN: [id number] Arrival date & time: 11/10/22  1742      History   Chief Complaint Chief Complaint  Patient presents with   Abdominal Pain   Appt    1800    HPI Amanda [name] is a 67 y.o. female.   Patient presents to urgent care for evaluation of chronic right thoracic paraspinal pain that radiates to the right upper quadrant abdomen that started in July-August 2023.  Patient was seen for this by her primary care provider when symptoms initially began and provided prescription for prednisone, which helped pain significantly and the pain went away. The same pain returned in October/November 2023 and patient was seen again by an orthopedic provider on October 29, 2022 where she received a steroid injection to her shoulder and another course of steroids that she states she finished approximately 2 weeks ago. History of lumbar spine surgery with multiple fusions by Dr. Vertell Limber in the past, denies recent orthopedic surgical procedures. History of colon cancer in remission. Denies blood/mucous to the stools, urinary symptoms, nausea, vomiting, diarrhea, fever/chills, saddle anesthesia symptoms, radicular symptoms, and numbness/tingling to the bilateral lower extremities. Pain described to the right side is currently a 4 on a scale of 0-10 and she states it has worsened over the last 2 weeks since seeing orthopedic provider. Pain is constant, but when she "sits very still and doesn't move it'll go away momentarily but come right back". Recently went to another urgent care where they performed an abdominal x-ray (no abnormalities) and urinalysis (no abnormalities). Has been using tylenol, muscle relaxer, and heat for pain without relief. Last colonoscopy was January 2023 and clean.    Abdominal Pain   Past Medical History:  Diagnosis Date   Anemia    Pt reports having to take iron supplements in the past but denies blood transfusion.   Anxiety    Arthritis     degenerative cerv. spine   Cancer ([place])    ca of the colon, 2005, rec'd chemo    Headache    due to sinus problem & cerv spine    Hyperlipidemia    Sickle cell trait ([place])    Sleep apnea 2008   +sleep apnea, has CPAP- doesn't use it     Patient Active Problem List   Diagnosis Date Noted   Spondylolisthesis of lumbar region 04/19/2018   Chronic right shoulder pain 08/18/2017   Cervical spondylosis without myelopathy 11/23/2014   ALLERGIC RHINITIS, SEASONAL 11/19/2008   HYPERLIPIDEMIA 05/24/2008   HOT FLASHES 05/24/2008   COLON CANCER, HX OF 05/24/2008   CARPAL TUNNEL SYNDROME, BILATERAL, HX OF 05/24/2008    Past Surgical History:  Procedure Laterality Date   ABDOMINAL HYSTERECTOMY     ANTERIOR CERVICAL DECOMP/DISCECTOMY FUSION N/A 11/23/2014   Procedure: Cervical three-four, Cervical four-five, Cervical five-six Anterior cervical decompression/diskectomy/fusion;  Surgeon: Erline Levine, MD;  Location: [facility];  Service: Neurosurgery;  Laterality: N/A;  C3-4 C4-5 C5-6 Anterior cervical decompression/diskectomy/fusion   CARPAL TUNNEL RELEASE Bilateral 2011   COLON SURGERY  2005   colon resection    EYE SURGERY     lasik bilateral    FOOT SURGERY Left    HERNIA REPAIR     umbilical hernia repair- 2005   MAXIMUM ACCESS (MAS)POSTERIOR LUMBAR INTERBODY FUSION (PLIF) 2 LEVEL N/A 04/19/2018   Procedure: L4-5 L5-S1 Maximum access posterior lumbar interbody fusion;  Surgeon: Erline Levine, MD;  Location: [facility];  Service: Neurosurgery;  Laterality: N/A;  L4-5  L5-S1 Maximum access posterior lumbar interbody fusion   Plastic surgery on back     PORTACATH PLACEMENT  2005   & then later removed   SHOULDER ARTHROSCOPY Right 01/2013   rotator cuff- still healing    TUBAL LIGATION      OB History   No obstetric history on file.      Home Medications    Prior to Admission medications   Medication Sig Start Date End Date Taking? Authorizing Provider  acetaminophen (TYLENOL) 500 MG  tablet Take 1,000 mg by mouth every 6 (six) hours as needed for mild pain or moderate pain.    Yes [provider]  Biotin 5000 MCG TABS Take 5,000 mcg by mouth daily.   Yes [provider]  Black Cohosh 40 MG CAPS Take 40 mg by mouth daily. My take a second '40mg'$  dose as needed for hot flashes   Yes [provider]  Calcium Carbonate-Vitamin D (CALCIUM PLUS VITAMIN D PO) Take by mouth.   Yes [provider]  cetirizine (ZYRTEC) 10 MG tablet Take 10 mg by mouth daily as needed for allergies.    Yes [provider]  Cyanocobalamin (B-12) 2500 MCG TABS Take 2,500 mcg by mouth daily.   Yes [provider]  diphenhydrAMINE (BENADRYL) 25 mg capsule Take 25 mg by mouth daily as needed for allergies.    Yes [provider]  Multiple Vitamins-Minerals (EYE VITAMINS & MINERALS) TABS Take 1 tablet by mouth daily.   Yes [provider]  rosuvastatin (CRESTOR) 10 MG tablet Take 10 mg by mouth daily.   Yes [provider]  tiZANidine (ZANAFLEX) 4 MG tablet Take 1 tablet (4 mg total) by mouth every 6 (six) hours as needed for muscle spasms. 11/10/22  Yes Talbot Grumbling, FNP  Turmeric 500 MG TABS Take 500 mg by mouth daily.   Yes [provider]  vitamin E 400 UNIT capsule Take 400 Units by mouth daily.   Yes [provider]  cholecalciferol (VITAMIN D) 1000 units tablet Take 1,000 Units by mouth daily.    [provider]  diclofenac sodium (VOLTAREN) 1 % GEL Apply 1 application topically 4 (four) times daily as needed (pain).    [provider]  folic acid (FOLVITE) 009 MCG tablet Take 400 mcg by mouth daily.    [provider]  gabapentin (NEURONTIN) 600 MG tablet Take 600 mg by mouth 2 (two) times daily.    [provider]  methylPREDNISolone (MEDROL) 4 MG tablet Take as directed 11/29/19   Pete Pelt, PA-C  neomycin-polymyxin-hydrocortisone (CORTISPORIN) 3.5-10000-1  OTIC suspension Place 4 drops into the right ear daily as needed (headaches/pain).    [provider]  oxyCODONE (OXY IR/ROXICODONE) 5 MG immediate release tablet Take 1-2 tablets (5-10 mg total) by mouth every 4 (four) hours as needed for moderate pain ((score 4 to 6)). 04/21/18   Erline Levine, MD  Red Yeast Rice 600 MG TABS Take 600 mg by mouth daily.    [provider]  simvastatin (ZOCOR) 20 MG tablet Take 20 mg by mouth every evening.     [provider]    Family History Family History  Problem Relation Age of Onset   Breast cancer Neg Hx     Social History Social History   Tobacco Use   Smoking status: Never   Smokeless tobacco: Never  Vaping Use   Vaping Use: Never used  Substance Use Topics   Alcohol  use: No   Drug use: Not Currently     Allergies   Vicodin [hydrocodone-acetaminophen]   Review of Systems Review of Systems  Gastrointestinal:  Positive for abdominal pain.   Per HPI  Physical Exam Triage Vital Signs ED Triage Vitals  Enc Vitals Group     BP 11/10/22 1815 (!) 149/77     Pulse Rate 11/10/22 1815 87     Resp 11/10/22 1815 18     Temp 11/10/22 1815 98.3 F (36.8 C)     Temp Source 11/10/22 1815 Oral     SpO2 11/10/22 1815 98 %     Weight --      Height --      Head Circumference --      Peak Flow --      Pain Score 11/10/22 1816 4     Pain Loc --      Pain Edu? --      Excl. in Axis? --    No data found.  Updated Vital Signs BP (!) 149/77   Pulse 87   Temp 98.3 F (36.8 C) (Oral)   Resp 18   SpO2 98%   Visual Acuity Right Eye Distance:   Left Eye Distance:   Bilateral Distance:    Right Eye Near:   Left Eye Near:    Bilateral Near:     Physical Exam Vitals and nursing note reviewed.  Constitutional:      Appearance: She is not ill-appearing or toxic-appearing.  HENT:     Head: Normocephalic and atraumatic.     Right Ear: Hearing and external ear normal.     Left Ear: Hearing and external ear  normal.     Nose: Nose normal.     Mouth/Throat:     Lips: Pink.  Eyes:     General: Lids are normal. Vision grossly intact. Gaze aligned appropriately.     Extraocular Movements: Extraocular movements intact.     Conjunctiva/sclera: Conjunctivae normal.  Cardiovascular:     Rate and Rhythm: Normal rate and regular rhythm.     Heart sounds: Normal heart sounds, S1 normal and S2 normal.  Pulmonary:     Effort: Pulmonary effort is normal. No respiratory distress.     Breath sounds: Normal breath sounds and air entry.  Abdominal:     General: Bowel sounds are normal. There is no distension or abdominal bruit. There are no signs of injury.     Palpations: Abdomen is soft. There is no fluid wave, hepatomegaly or mass.     Tenderness: There is abdominal tenderness in the right upper quadrant. There is no right CVA tenderness, left CVA tenderness or guarding.     Comments: No peritoneal signs to abdomen.   Musculoskeletal:     Cervical back: Normal and neck supple.     Thoracic back: Tenderness present. No swelling, edema, deformity, signs of trauma, lacerations, spasms or bony tenderness. Normal range of motion. No scoliosis.     Lumbar back: Normal.       Back:     Comments: TTP to the right-sided thoracic paraspinals and right flank region as indicated above. No abnormalities to the skin, obvious deformities, or bony tenderness to the C, T, or L spine. ROM normal. Sensation and strength intact to bilateral upper and lower extremities (5/5 with grips and dorsi/plantar flexion against resistance).   Skin:    General: Skin is warm and dry.     Capillary Refill: Capillary refill takes less than  2 seconds.     Findings: No rash.  Neurological:     General: No focal deficit present.     Mental Status: She is alert and oriented to person, place, and time. Mental status is at baseline.     Cranial Nerves: No dysarthria or facial asymmetry.  Psychiatric:        Mood and Affect: Mood normal.         Speech: Speech normal.        Behavior: Behavior normal.        Thought Content: Thought content normal.        Judgment: Judgment normal.      UC Treatments / Results  Labs (all labs ordered are listed, but only abnormal results are displayed) Labs Reviewed  CBC    EKG   Radiology   Procedures Procedures (including critical care time)  Medications Ordered in UC Medications  ketorolac (TORADOL) 30 MG/ML injection 15 mg (15 mg Intramuscular Given 11/10/22 1911)    Initial Impression / Assessment and Plan / UC Course  I have reviewed the triage vital signs and the nursing notes.  Pertinent labs & imaging results that were available during my care of the patient were reviewed by me and considered in my medical decision making (see chart for details).   1. Chronic abdominal pain to the right upper quadrant, chronic thoracic back pain Unclear etiology of patient's ongoing pain. Reviewed results from urgent care visit on October 25, 2022 brought in by patient via paperwork and urinalysis/KUB x-ray negative for acute abnormality. Urinalysis unremarkable for signs of urinary involvement/urinary tract infection.   *** Final Clinical Impressions(s) / UC Diagnoses   Final diagnoses:  Abdominal pain, chronic, right upper quadrant  Chronic right-sided thoracic back pain     Discharge Instructions      Follow-up with your primary care provider on January 8 as scheduled. I think it would be beneficial for you to be seen again by your previous oncologist.  Your primary care provider will schedule this and work with you to get this done.  You may also reach out to them yourself to schedule an appointment.  I do some blood work in the clinic to assess for your electrolyte function and  your blood levels.  I will call you if any of your blood work is abnormal.  You were given a shot of Toradol in the clinic today.  This is a strong anti-inflammatory medicine that can help to  reduce your pain and inflammation to the area. Do not take any ibuprofen until tomorrow since I gave you the Toradol injection in the clinic.  Continue using heat and gentle range of motion exercises to the your right side.  If you develop any new or worsening symptoms before your appointment on January 8, please go to the nearest emergency room for further evaluation and management of your pain.  Otherwise, you may follow-up with urgent care as needed.  I hope you feel better!     ED Prescriptions     Medication Sig Dispense Auth. Provider   tiZANidine (ZANAFLEX) 4 MG tablet Take 1 tablet (4 mg total) by mouth every 6 (six) hours as needed for muscle spasms. 30 tablet Talbot Grumbling, FNP      PDMP not reviewed this encounter.

## 2022-11-10 NOTE — ED Triage Notes (Signed)
Pt c/o RUQ pain; states pain has been occurring since summer; saw PCP in Nov 2023 & was given course of prednisone for back pain; pt states though she was tender in right flank area, she is also having RUQ pain and the prednisone seemed to help. Pt was seen at an urgent care 10/25/22 and evaluated, having had a KUB, abd XR, UA and was diagnosed with back pain. Pt states the pain to both RUQ dullness and right flank is worse at night, and she can feel it when she bends to the right. Reports an area of swelling to RUQ that is visible.

## 2022-11-12 ENCOUNTER — Ambulatory Visit
Admission: RE | Admit: 2022-11-12 | Discharge: 2022-11-12 | Disposition: A | Discharge status: Home / Self Care | Payer: Medicare HMO | Source: Ambulatory Visit | Attending: Family Medicine | Admitting: Family Medicine

## 2022-11-12 DIAGNOSIS — Z1231 Encounter for screening mammogram for malignant neoplasm of breast: Secondary | ICD-10-CM

## 2022-11-16 ENCOUNTER — Telehealth: Payer: Self-pay | Admitting: Physician Assistant

## 2022-11-16 ENCOUNTER — Ambulatory Visit: Payer: Medicare HMO | Admitting: Physical Therapy

## 2022-11-16 ENCOUNTER — Encounter: Payer: Self-pay | Admitting: Physical Therapy

## 2022-11-16 DIAGNOSIS — M5459 Other low back pain: Secondary | ICD-10-CM

## 2022-11-16 NOTE — Telephone Encounter (Signed)
Called patient about her continued pain.  She is seen in physical therapy there is to be an area swelling and pain by the therapist and right upper quadrant.  This is also noted on nurse practitioners note from Monday 1-24 when she was seen in the ER.  Both noted right upper quadrant tenderness which I did not appreciate on my exam on 10/29/2022.  Area of tenderness pain was in the right lumbar paraspinous region at approximately L3-4.  Patient states that her pain has not changed.  She denies any tenderness in her rib cage region.  Therefore recommend that she see her primary care physician again Dr. Ayesha Rumpf for evaluation and further workup.  If she is unable to be seen in Dr. Kallie Edward office or scan is not ordered.  She can always contact us so we can see her for reevaluation however I do not feel given the right upper quadrant pain that is orthopedic in nature.

## 2022-11-16 NOTE — Therapy (Signed)
OUTPATIENT PHYSICAL THERAPY THORACOLUMBAR EVALUATION   Patient Name: Amanda [name] MRN: [id number] DOB:1956/08/13, 67 y.o., female Today's Date: 11/16/2022  END OF SESSION:  PT End of Session - 11/16/22 1105     Visit Number 1    Authorization Type [employer] MCR    Authorization Time Period 11/16/22 to 11/16/22    Progress Note Due on Visit 10    PT Start Time 1056    PT Stop Time 1134    PT Time Calculation (min) 38 min    Activity Tolerance Patient tolerated treatment well    Behavior During Therapy WFL for tasks assessed/performed             Past Medical History:  Diagnosis Date   Anemia    Pt reports having to take iron supplements in the past but denies blood transfusion.   Anxiety    Arthritis    degenerative cerv. spine   Cancer ([place])    ca of the colon, 2005, rec'd chemo    Headache    due to sinus problem & cerv spine    Hyperlipidemia    Sickle cell trait ([place])    Sleep apnea 2008   +sleep apnea, has CPAP- doesn't use it    Past Surgical History:  Procedure Laterality Date   ABDOMINAL HYSTERECTOMY     ANTERIOR CERVICAL DECOMP/DISCECTOMY FUSION N/A 11/23/2014   Procedure: Cervical three-four, Cervical four-five, Cervical five-six Anterior cervical decompression/diskectomy/fusion;  Surgeon: Erline Levine, MD;  Location: [facility];  Service: Neurosurgery;  Laterality: N/A;  C3-4 C4-5 C5-6 Anterior cervical decompression/diskectomy/fusion   CARPAL TUNNEL RELEASE Bilateral 2011   COLON SURGERY  2005   colon resection    EYE SURGERY     lasik bilateral    FOOT SURGERY Left    HERNIA REPAIR     umbilical hernia repair- 2005   MAXIMUM ACCESS (MAS)POSTERIOR LUMBAR INTERBODY FUSION (PLIF) 2 LEVEL N/A 04/19/2018   Procedure: L4-5 L5-S1 Maximum access posterior lumbar interbody fusion;  Surgeon: Erline Levine, MD;  Location: [place];  Service: Neurosurgery;  Laterality: N/A;  L4-5 L5-S1 Maximum access posterior lumbar interbody fusion   Plastic surgery on back      PORTACATH PLACEMENT  2005   & then later removed   SHOULDER ARTHROSCOPY Right 01/2013   rotator cuff- still healing    TUBAL LIGATION     Patient Active Problem List   Diagnosis Date Noted   Spondylolisthesis of lumbar region 04/19/2018   Chronic right shoulder pain 08/18/2017   Cervical spondylosis without myelopathy 11/23/2014   ALLERGIC RHINITIS, SEASONAL 11/19/2008   HYPERLIPIDEMIA 05/24/2008   HOT FLASHES 05/24/2008   COLON CANCER, HX OF 05/24/2008   CARPAL TUNNEL SYNDROME, BILATERAL, HX OF 05/24/2008    PCP: Jackson Latino MD   REFERRING PROVIDER: Pete Pelt, PA-C  REFERRING DIAG:  Diagnosis  M25.511,G89.29 (ICD-10-CM) - Chronic right shoulder pain   And LBP per notes in referral   Rationale for Evaluation and Treatment: Rehabilitation  THERAPY DIAG:  Other low back pain  ONSET DATE: 10/29/2022  SUBJECTIVE:  SUBJECTIVE STATEMENT:  Everything is all mixed up, I hurt my right side awhile ago and they solved it with prednisone which helped bu then it came back with a "Squiggly pain". I went to urgent care and Artis Delay, all my organs were cleared. If I sidebend to the right or bend forward I really feel the pain, its not like that all the time and my right side is really swollen too. No change with bowel movements. No consistent pattern to my pain that I've noticed. Shoulder is not my concern, it feels much better after the shot I got in it.   PERTINENT HISTORY:  Assessment & Plan: Visit Diagnoses:  1. Acute pain of right shoulder   2. Acute midline low back pain without sciatica       Plan: We will send her for therapy to her low back mostly modalities to the area of maximal tenderness and include dry needling, time a TENS unit or any other modalities deemed appropriate by physical  therapy.  They can also work on back exercises.  In regards to her shoulder she is shown shoulder exercises to perform on her own.  Will see her back if 4 weeks after starting therapy for back to see how she is doing in regards to her back and her shoulder she can always return sooner if there is any questions or concerns.  PAIN:  Are you having pain? Yes: NPRS scale: 4/10 Pain location: R side of trunk, obliques  Pain description: dull  Aggravating factors: bending forward, sidebending to R or L, forward bending with rotation to L  Relieving factors: nothing just comes and goes but does have constant mm strain feeling in paraspinals   PRECAUTIONS: None  WEIGHT BEARING RESTRICTIONS: No  FALLS:  Has patient fallen in last 6 months? No  LIVING ENVIRONMENT: Lives with: lives alone Lives in: House/apartment Stairs: no STE in the back, no steps inside  Has following equipment at home: None  OCCUPATION: retired   PLOF: Independent, Independent with basic ADLs, Independent with gait, and Independent with transfers  PATIENT GOALS: find out what's causing this pain   NEXT MD VISIT: PRN with referring   OBJECTIVE:   DIAGNOSTIC FINDINGS:  Right shoulder 3 views: Shoulder is well located.  No significant  arthritic changes.  Type II downsloping acromion.  No acute fractures or  acute findings.  PATIENT SURVEYS:  FOTO 53, predicted 54   SCREENING FOR RED FLAGS: Bowel or bladder incontinence: No Spinal tumors: No Cauda equina syndrome: No Compression fracture: No Abdominal aneurysm: No  COGNITION: Overall cognitive status: Within functional limits for tasks assessed     SENSATION: WFL  MUSCLE LENGTH:  HS and piriformis flexibility WNL   POSTURE: rounded shoulders and forward head  PALPATION:  LUMBAR ROM:   AROM eval  Flexion WNL   Extension WNL   Right lateral flexion WNL   Left lateral flexion WNL   Right rotation   Left rotation    (Blank rows = not  tested)  LOWER EXTREMITY ROM:     Active  Right eval Left eval  Hip flexion    Hip extension    Hip abduction    Hip adduction    Hip internal rotation    Hip external rotation    Knee flexion    Knee extension    Ankle dorsiflexion    Ankle plantarflexion    Ankle inversion    Ankle eversion     (Blank rows = not  tested)  LOWER EXTREMITY MMT:    MMT Right eval Left eval  Hip flexion 4 4-  Hip extension 3 3  Hip abduction 3 3  Hip adduction    Hip internal rotation    Hip external rotation    Knee flexion 4 4  Knee extension 4+ 4+  Ankle dorsiflexion 5 5  Ankle plantarflexion    Ankle inversion    Ankle eversion     (Blank rows = not tested)  LUMBAR SPECIAL TESTS:   Lumbar paraspinals tight but not TTP, very tender over general area of R kidney, kidney thump test (+) R  SLR test (-)  No guarding noted in LUQ, LUQ, RLQ, no pain RUQ but area over liver feels more solid or swollen not painful   FUNCTIONAL TESTS:    GAIT: Distance walked: in clinic distances  Assistive device utilized: None Level of assistance: Complete Independence Comments: WNL   TODAY'S TREATMENT:                                                                                                                              DATE:   Eval   Objective measures + appropriate education     PATIENT EDUCATION:  Education details: exam findings do not match MSK pattern, PT concern about kidney or liver involvement given pain patterns and exam findings, no PT needed here for MSK care would recommend return to MD for further imaging and next steps, DN not helpful in this case  Person educated: Patient Education method: Explanation Education comprehension: verbalized understanding  HOME EXERCISE PROGRAM: N/A   ASSESSMENT:  CLINICAL IMPRESSION: Patient is a 67 y.o. F who was seen today for physical therapy evaluation and treatment for R shoulder pain and LBP. Per her report, R shoulder is  much better after the shot she received from MD and no longer a concern- of much more concern to her is the pain in her back. Generally, it does not follow usual patterns of MSK pain in the context of possible mm spasm or mm sprain- with the exception of palpation and thump test over R kidney I was unable to provoke any pain and all objective measures appeared to be at her baseline. Of note, her pain is to the right of thoracic paraspinals, very sensitive to palpation and thump test, also manual palpation of RUQ reveals a tight, "boggy" feeling but was pain-free no guarding noted. I did note increased bogginess with palpation of R obliques and RUQ as well as compared to L, also some possible increased edema around general area of R kidney. She is usually on muscle relaxants due to hx of back pain after lumbar surgery but these have not given any relief to this new problem, although historically prednisone has helped quite a bit indicating inflammatory process.   At this time per PT exam, I do not feel that her pain is from a MSK source- actually I am concerned  about the possibility of kidney involvement here. Do not feel that she would benefit from skilled PT services, but I do feel that she would benefit from possible MRI or [place] to further investigate possible organ involvement and recommend return to MD for f/u with next steps in this process.   OBJECTIVE IMPAIRMENTS: pain.   ACTIVITY LIMITATIONS: bending  PARTICIPATION LIMITATIONS: community activity  PERSONAL FACTORS: Age, Behavior pattern, and Past/current experiences are also affecting patient's functional outcome.   REHAB POTENTIAL: Fair PT does not feel pain is from MSK source   CLINICAL DECISION MAKING: Evolving/moderate complexity  EVALUATION COMPLEXITY: Moderate   GOALS: Goals reviewed with patient? Yes  SHORT TERM GOALS: Target date: 11/16/22  Will be educated on PT concerns and next step in process of identifying cause of  symptoms/pain  Baseline: Goal status: MET    LONG TERM GOALS: Target date: no LTGs appropriate   PLAN:  PT FREQUENCY: one time visit  PT DURATION: other: eval only   PLANNED INTERVENTIONS: Therapeutic exercises, Therapeutic activity, Neuromuscular re-education, Balance training, Gait training, Patient/Family education, Self Care, and Joint mobilization.  PLAN FOR NEXT SESSION: N/A eval only    Deniece Ree PT DPT PN2  11/16/2022, 11:49 AM

## 2022-12-15 ENCOUNTER — Other Ambulatory Visit: Payer: Self-pay | Admitting: Family Medicine

## 2022-12-15 DIAGNOSIS — M5459 Other low back pain: Secondary | ICD-10-CM

## 2022-12-18 ENCOUNTER — Other Ambulatory Visit: Payer: Self-pay | Admitting: Family Medicine

## 2022-12-18 DIAGNOSIS — M5459 Other low back pain: Secondary | ICD-10-CM

## 2022-12-22 ENCOUNTER — Other Ambulatory Visit: Payer: Self-pay | Admitting: Family Medicine

## 2022-12-22 DIAGNOSIS — R10811 Right upper quadrant abdominal tenderness: Secondary | ICD-10-CM

## 2023-01-02 ENCOUNTER — Ambulatory Visit
Admission: RE | Admit: 2023-01-02 | Discharge: 2023-01-02 | Disposition: A | Discharge status: Home / Self Care | Payer: Medicare HMO | Source: Ambulatory Visit | Attending: Family Medicine | Admitting: Family Medicine

## 2023-01-02 DIAGNOSIS — R10811 Right upper quadrant abdominal tenderness: Secondary | ICD-10-CM

## 2023-01-02 MED ORDER — GADOPICLENOL 0.5 MMOL/ML IV SOLN
7.0000 mL | Freq: Once | INTRAVENOUS | Status: AC | PRN
  Administered 2023-01-02: 7 mL via INTRAVENOUS

## 2023-01-04 ENCOUNTER — Ambulatory Visit: Payer: Medicare HMO | Admitting: Orthopaedic Surgery

## 2023-01-04 ENCOUNTER — Encounter: Payer: Self-pay | Admitting: Orthopaedic Surgery

## 2023-01-04 DIAGNOSIS — M25511 Pain in right shoulder: Secondary | ICD-10-CM

## 2023-01-04 DIAGNOSIS — G8929 Other chronic pain: Secondary | ICD-10-CM

## 2023-01-04 DIAGNOSIS — M25512 Pain in left shoulder: Secondary | ICD-10-CM | POA: Diagnosis not present

## 2023-01-04 MED ORDER — METHYLPREDNISOLONE ACETATE 40 MG/ML IJ SUSP
40.0000 mg | INTRAMUSCULAR | Status: AC | PRN
  Administered 2023-01-04: 40 mg via INTRA_ARTICULAR

## 2023-01-04 MED ORDER — LIDOCAINE HCL 1 % IJ SOLN
3.0000 mL | INTRAMUSCULAR | Status: AC | PRN
  Administered 2023-01-04: 3 mL

## 2023-01-04 NOTE — Progress Notes (Signed)
The patient is well-known to me.  We operated on her right shoulder with a rotator cuff repair 10 years ago.  She still has chronic pain with the right shoulder and did have a steroid injection just in December in the subacromial space and she said that did not really help her much.  She is complaining of left shoulder pain today in the same area.  She is 67 years old.  On exam both shoulders move smoothly and fluidly but do have pain in the subdeltoid area on both shoulders and subacromial area.  She is not a diabetic.  I did recommend a steroid injection in her right shoulder subacromial space today which she wanted to try as well.  She tolerated it well.  I did offer outpatient physical therapy but she wants to see how the injection does so we will see her back in 4 weeks for repeat exam but no x-rays are needed.      Procedure Note  Patient: Amanda [name]             Date of Birth: 1956-08-30           MRN: [id number]             Visit Date: 01/04/2023  Procedures: Visit Diagnoses:  1. Chronic left shoulder pain   2. Chronic right shoulder pain     Large Joint Inj: L subacromial bursa on 01/04/2023 1:22 PM Indications: pain and diagnostic evaluation Details: 22 G 1.5 in needle  Arthrogram: No  Medications: 3 mL lidocaine 1 %; 40 mg methylPREDNISolone acetate 40 MG/ML Outcome: tolerated well, no immediate complications Procedure, treatment alternatives, risks and benefits explained, specific risks discussed. Consent was given by the patient. Immediately prior to procedure a time out was called to verify the correct patient, procedure, equipment, support staff and site/side marked as required. Patient was prepped and draped in the usual sterile fashion.

## 2023-01-14 ENCOUNTER — Encounter: Payer: Self-pay | Admitting: Radiology

## 2023-02-01 ENCOUNTER — Ambulatory Visit: Payer: Medicare HMO | Admitting: Orthopaedic Surgery

## 2023-02-03 ENCOUNTER — Other Ambulatory Visit: Payer: Self-pay | Admitting: Family Medicine

## 2023-02-03 DIAGNOSIS — M5459 Other low back pain: Secondary | ICD-10-CM

## 2023-02-10 ENCOUNTER — Encounter: Payer: Self-pay | Admitting: Orthopaedic Surgery

## 2023-02-10 ENCOUNTER — Ambulatory Visit: Payer: Medicare HMO | Admitting: Orthopaedic Surgery

## 2023-02-10 DIAGNOSIS — M25512 Pain in left shoulder: Secondary | ICD-10-CM

## 2023-02-10 DIAGNOSIS — G8929 Other chronic pain: Secondary | ICD-10-CM | POA: Diagnosis not present

## 2023-02-10 DIAGNOSIS — M25511 Pain in right shoulder: Secondary | ICD-10-CM | POA: Diagnosis not present

## 2023-02-10 MED ORDER — LIDOCAINE HCL 1 % IJ SOLN
3.0000 mL | INTRAMUSCULAR | Status: AC | PRN
  Administered 2023-02-10: 3 mL

## 2023-02-10 MED ORDER — METHYLPREDNISOLONE ACETATE 40 MG/ML IJ SUSP
40.0000 mg | INTRAMUSCULAR | Status: AC | PRN
  Administered 2023-02-10: 40 mg via INTRA_ARTICULAR

## 2023-02-10 NOTE — Progress Notes (Signed)
The patient comes in today requesting a steroid injection in her right shoulder.  We last did this in the subacromial space in December which has now been over 3 months.  He did work for a while.  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  She is 28.  She is very active.  She still gets clicking in both shoulders.  She has remote history of right shoulder surgery.  On exam both shoulders move smoothly and fluidly and there is no significant weakness in the rotator cuff.  She says pain at the subacromial outlet of both shoulders but it is mild.  Per request I did provide a steroid injection in her right shoulder subacromial outlet which she tolerated well.  At this point follow-up can be as needed.    Procedure Note  Patient: Amanda [name]             Date of Birth: 09/22/1956           MRN: [id number]             Visit Date: 02/10/2023  Procedures: Visit Diagnoses:  1. Chronic right shoulder pain   2. Chronic left shoulder pain     Large Joint Inj: R subacromial bursa on 02/10/2023 1:57 PM Indications: pain and diagnostic evaluation Details: 22 G 1.5 in needle  Arthrogram: No  Medications: 3 mL lidocaine 1 %; 40 mg methylPREDNISolone acetate 40 MG/ML Outcome: tolerated well, no immediate complications Procedure, treatment alternatives, risks and benefits explained, specific risks discussed. Consent was given by the patient. Immediately prior to procedure a time out was called to verify the correct patient, procedure, equipment, support staff and site/side marked as required. Patient was prepped and draped in the usual sterile fashion.

## 2023-02-26 ENCOUNTER — Other Ambulatory Visit: Payer: Medicare HMO

## 2023-03-09 ENCOUNTER — Other Ambulatory Visit: Payer: Medicare HMO

## 2023-06-04 ENCOUNTER — Other Ambulatory Visit: Payer: Self-pay | Admitting: Family Medicine

## 2023-06-04 DIAGNOSIS — Z0000 Encounter for general adult medical examination without abnormal findings: Secondary | ICD-10-CM

## 2023-11-15 ENCOUNTER — Ambulatory Visit: Payer: Medicare HMO

## 2023-11-26 ENCOUNTER — Ambulatory Visit
Admission: RE | Admit: 2023-11-26 | Discharge: 2023-11-26 | Disposition: A | Discharge status: Home / Self Care | Payer: Medicare HMO | Source: Ambulatory Visit | Attending: Family Medicine | Admitting: Family Medicine

## 2023-11-26 DIAGNOSIS — Z0000 Encounter for general adult medical examination without abnormal findings: Secondary | ICD-10-CM

## 2023-12-01 ENCOUNTER — Ambulatory Visit: Payer: Medicare HMO

## 2024-03-20 ENCOUNTER — Telehealth: Payer: Self-pay | Admitting: Podiatry

## 2024-03-20 NOTE — Telephone Encounter (Signed)
 Pt came in for a follow-up on her lamisil medication that she has been taking. She usually got her blood work done at her previous appts at the old office, can an order be sent to [facility] to get her blood taken and go over her results at her follow-up here?

## 2024-03-22 ENCOUNTER — Encounter: Payer: Self-pay | Admitting: Podiatry

## 2024-03-22 ENCOUNTER — Ambulatory Visit: Admitting: Podiatry

## 2024-03-22 DIAGNOSIS — M79671 Pain in right foot: Secondary | ICD-10-CM

## 2024-03-22 DIAGNOSIS — M79672 Pain in left foot: Secondary | ICD-10-CM

## 2024-03-22 DIAGNOSIS — B351 Tinea unguium: Secondary | ICD-10-CM

## 2024-03-22 NOTE — Progress Notes (Signed)
   Subjective:    HPI Presents for follow-up onychomycosis treatment with p.o. Lamisil.  No problems taking medicine with no side effects noted.   Objective:  Physical Exam   General: AAO x3, NAD  Vascular: DP and PT pulses palpable bilaterally.  Immedate capillary fill time digits. No significant lower extremity edema bilaterally.  Dermatological: Onychomycotic mycotic changes nails 1 through 5 with discoloration nail and subungual debris and thickening of the nail. 50% Clearance of onychomycotic nail changes noted. Tenderness with walking and wearing shoes.  Neruologic: Grossly intact B/L  Musculoskeletal:   Assessment:  Painful onychomycotic nails 1 through 5 bilaterally. 2.  Pain feet bilaterally    Plan:  --Office visit level 2 for evaluation and management -Patient appears to be responding well to Lamisil treatment for onychomycosis.  Nails are about 50% cleared.  If not cleared in another 6 month call for appointment -Preventative things she can do in terms of socks and shoes to prevent recurrence of the onychomycosis. - Return prn

## 2024-03-28 ENCOUNTER — Ambulatory Visit: Admitting: Podiatry

## 2024-10-24 ENCOUNTER — Other Ambulatory Visit: Payer: Self-pay | Admitting: Family Medicine

## 2024-10-24 DIAGNOSIS — Z1231 Encounter for screening mammogram for malignant neoplasm of breast: Secondary | ICD-10-CM

## 2024-12-01 ENCOUNTER — Ambulatory Visit
Admission: RE | Admit: 2024-12-01 | Discharge: 2024-12-01 | Disposition: A | Source: Ambulatory Visit | Attending: Family Medicine | Admitting: Family Medicine

## 2024-12-01 DIAGNOSIS — Z1231 Encounter for screening mammogram for malignant neoplasm of breast: Secondary | ICD-10-CM

## 2024-12-04 ENCOUNTER — Ambulatory Visit

## 2024-12-07 ENCOUNTER — Ambulatory Visit: Admitting: Orthopaedic Surgery

## 2024-12-07 ENCOUNTER — Encounter: Payer: Self-pay | Admitting: Orthopaedic Surgery

## 2024-12-07 DIAGNOSIS — M25511 Pain in right shoulder: Secondary | ICD-10-CM | POA: Diagnosis not present

## 2024-12-07 DIAGNOSIS — M65341 Trigger finger, right ring finger: Secondary | ICD-10-CM | POA: Diagnosis not present

## 2024-12-07 DIAGNOSIS — G8929 Other chronic pain: Secondary | ICD-10-CM | POA: Diagnosis not present

## 2024-12-07 MED ORDER — LIDOCAINE HCL 1 % IJ SOLN
0.5000 mL | INTRAMUSCULAR | Status: AC | PRN
  Administered 2024-12-07: .5 mL

## 2024-12-07 MED ORDER — METHYLPREDNISOLONE ACETATE 40 MG/ML IJ SUSP
40.0000 mg | INTRAMUSCULAR | Status: AC | PRN
  Administered 2024-12-07: 40 mg via INTRA_ARTICULAR

## 2024-12-07 MED ORDER — METHYLPREDNISOLONE ACETATE 40 MG/ML IJ SUSP
40.0000 mg | INTRAMUSCULAR | Status: AC | PRN
  Administered 2024-12-07: 40 mg

## 2024-12-07 MED ORDER — LIDOCAINE HCL 1 % IJ SOLN
3.0000 mL | INTRAMUSCULAR | Status: AC | PRN
  Administered 2024-12-07: 3 mL

## 2024-12-07 NOTE — Progress Notes (Signed)
 The patient is a 69 year old female well-known to us .  She has chronic right shoulder issues and she has had surgery on that shoulder.  It has been well over a year since she has had an injection in the right shoulder.  She also reports triggering of her right ring finger and has been getting stuck today.  Last time she had a shoulder injection was back in April 2024.  She comes in today hoping to have a steroid injection in her right shoulder and for us  to treat her right ring finger trigger finger.  On exam the right shoulder has painful throughout the arc of motion mainly in the subacromial outlet area.  She does have good motion overall but definitely some chronic pain.  Her right ring finger has active triggering and pain over the A1 pulley.  I did recommend a steroid injection over the A1 pulley of the right ring finger and her right shoulder subacromial outlet.  She agreed to these and tolerated them well.  She understands that if there is recurrence of the triggering we could recommend an A1 pulley release.    Procedure Note  Patient: Amanda [name]             Date of Birth: Apr 07, 1956           MRN: [id number]             Visit Date: 12/07/2024  Procedures: Visit Diagnoses:  1. Chronic right shoulder pain   2. Trigger finger, right ring finger     Large Joint Inj: R subacromial bursa on 12/07/2024 3:42 PM Indications: pain and diagnostic evaluation Details: 22 G 1.5 in needle  Arthrogram: No  Medications: 3 mL lidocaine  1 %; 40 mg methylPREDNISolone  acetate 40 MG/ML Outcome: tolerated well, no immediate complications Procedure, treatment alternatives, risks and benefits explained, specific risks discussed. Consent was given by the patient. Immediately prior to procedure a time out was called to verify the correct patient, procedure, equipment, support staff and site/side marked as required. Patient was prepped and draped in the usual sterile fashion.    Hand/UE Inj: R ring A1  for trigger finger on 12/07/2024 3:42 PM Medications: 0.5 mL lidocaine  1 %; 40 mg methylPREDNISolone  acetate 40 MG/ML
